# Patient Record
Sex: Male | Born: 1955 | Race: Black or African American | Hispanic: No | Smoking: Current every day smoker
Health system: Southern US, Community
[De-identification: ages and names within clinical notes are randomized; demographics above are authoritative.]

## PROBLEM LIST (undated history)

## (undated) DIAGNOSIS — E119 Type 2 diabetes mellitus without complications: Secondary | ICD-10-CM

## (undated) DIAGNOSIS — M549 Dorsalgia, unspecified: Secondary | ICD-10-CM

## (undated) DIAGNOSIS — E7800 Pure hypercholesterolemia, unspecified: Secondary | ICD-10-CM

## (undated) DIAGNOSIS — B1920 Unspecified viral hepatitis C without hepatic coma: Secondary | ICD-10-CM

## (undated) DIAGNOSIS — I10 Essential (primary) hypertension: Secondary | ICD-10-CM

## (undated) HISTORY — PX: HIP SURGERY: SHX245

## (undated) HISTORY — PX: SHOULDER SURGERY: SHX246

## (undated) HISTORY — PX: BACK SURGERY: SHX140

---

## 1998-03-19 ENCOUNTER — Emergency Department (HOSPITAL_COMMUNITY): Admission: EM | Admit: 1998-03-19 | Discharge: 1998-03-19 | Payer: Self-pay | Admitting: Emergency Medicine

## 2000-09-12 ENCOUNTER — Emergency Department (HOSPITAL_COMMUNITY): Admission: EM | Admit: 2000-09-12 | Discharge: 2000-09-13 | Payer: Self-pay | Admitting: Emergency Medicine

## 2001-12-17 ENCOUNTER — Encounter: Payer: Self-pay | Admitting: Internal Medicine

## 2001-12-17 ENCOUNTER — Ambulatory Visit (HOSPITAL_COMMUNITY): Admission: RE | Admit: 2001-12-17 | Discharge: 2001-12-17 | Payer: Self-pay | Admitting: Internal Medicine

## 2002-02-03 ENCOUNTER — Ambulatory Visit (HOSPITAL_COMMUNITY): Admission: RE | Admit: 2002-02-03 | Discharge: 2002-02-03 | Payer: Self-pay | Admitting: Neurosurgery

## 2002-02-13 ENCOUNTER — Encounter: Payer: Self-pay | Admitting: Neurosurgery

## 2002-02-13 ENCOUNTER — Ambulatory Visit (HOSPITAL_COMMUNITY): Admission: RE | Admit: 2002-02-13 | Discharge: 2002-02-13 | Payer: Self-pay | Admitting: Neurosurgery

## 2002-02-23 ENCOUNTER — Emergency Department (HOSPITAL_COMMUNITY): Admission: EM | Admit: 2002-02-23 | Discharge: 2002-02-23 | Payer: Self-pay | Admitting: Emergency Medicine

## 2002-02-23 ENCOUNTER — Encounter: Payer: Self-pay | Admitting: Emergency Medicine

## 2002-03-01 ENCOUNTER — Encounter: Payer: Self-pay | Admitting: Emergency Medicine

## 2002-03-01 ENCOUNTER — Emergency Department (HOSPITAL_COMMUNITY): Admission: EM | Admit: 2002-03-01 | Discharge: 2002-03-01 | Payer: Self-pay | Admitting: Emergency Medicine

## 2002-03-31 ENCOUNTER — Encounter: Payer: Self-pay | Admitting: Neurosurgery

## 2002-04-04 ENCOUNTER — Encounter: Payer: Self-pay | Admitting: Neurosurgery

## 2002-04-04 ENCOUNTER — Inpatient Hospital Stay (HOSPITAL_COMMUNITY): Admission: RE | Admit: 2002-04-04 | Discharge: 2002-04-11 | Payer: Self-pay | Admitting: Neurosurgery

## 2002-04-11 ENCOUNTER — Inpatient Hospital Stay (HOSPITAL_COMMUNITY)
Admission: RE | Admit: 2002-04-11 | Discharge: 2002-04-17 | Payer: Self-pay | Admitting: Physical Medicine & Rehabilitation

## 2011-06-11 DIAGNOSIS — B182 Chronic viral hepatitis C: Secondary | ICD-10-CM | POA: Insufficient documentation

## 2011-06-11 DIAGNOSIS — Z720 Tobacco use: Secondary | ICD-10-CM | POA: Insufficient documentation

## 2011-07-12 DIAGNOSIS — G894 Chronic pain syndrome: Secondary | ICD-10-CM | POA: Insufficient documentation

## 2012-02-05 DIAGNOSIS — Z9989 Dependence on other enabling machines and devices: Secondary | ICD-10-CM | POA: Insufficient documentation

## 2012-09-15 DIAGNOSIS — M533 Sacrococcygeal disorders, not elsewhere classified: Secondary | ICD-10-CM | POA: Insufficient documentation

## 2013-02-18 DIAGNOSIS — F309 Manic episode, unspecified: Secondary | ICD-10-CM | POA: Insufficient documentation

## 2013-03-22 DIAGNOSIS — Z981 Arthrodesis status: Secondary | ICD-10-CM | POA: Insufficient documentation

## 2013-03-22 DIAGNOSIS — Q675 Congenital deformity of spine: Secondary | ICD-10-CM | POA: Insufficient documentation

## 2013-05-11 DIAGNOSIS — M4325 Fusion of spine, thoracolumbar region: Secondary | ICD-10-CM | POA: Insufficient documentation

## 2013-08-09 DIAGNOSIS — M21371 Foot drop, right foot: Secondary | ICD-10-CM | POA: Insufficient documentation

## 2013-08-09 DIAGNOSIS — M21372 Foot drop, left foot: Secondary | ICD-10-CM | POA: Insufficient documentation

## 2013-12-29 DIAGNOSIS — M961 Postlaminectomy syndrome, not elsewhere classified: Secondary | ICD-10-CM | POA: Insufficient documentation

## 2013-12-29 DIAGNOSIS — F329 Major depressive disorder, single episode, unspecified: Secondary | ICD-10-CM | POA: Insufficient documentation

## 2013-12-29 DIAGNOSIS — F32A Depression, unspecified: Secondary | ICD-10-CM | POA: Insufficient documentation

## 2013-12-29 DIAGNOSIS — G4700 Insomnia, unspecified: Secondary | ICD-10-CM | POA: Insufficient documentation

## 2013-12-29 DIAGNOSIS — E119 Type 2 diabetes mellitus without complications: Secondary | ICD-10-CM | POA: Insufficient documentation

## 2013-12-29 DIAGNOSIS — I10 Essential (primary) hypertension: Secondary | ICD-10-CM | POA: Insufficient documentation

## 2014-04-13 DIAGNOSIS — N529 Male erectile dysfunction, unspecified: Secondary | ICD-10-CM | POA: Insufficient documentation

## 2014-04-13 DIAGNOSIS — Z96643 Presence of artificial hip joint, bilateral: Secondary | ICD-10-CM | POA: Insufficient documentation

## 2014-06-19 ENCOUNTER — Emergency Department (HOSPITAL_BASED_OUTPATIENT_CLINIC_OR_DEPARTMENT_OTHER)
Admission: EM | Admit: 2014-06-19 | Discharge: 2014-06-19 | Disposition: A | Discharge status: Home / Self Care | Payer: Medicare Other | Attending: Emergency Medicine | Admitting: Emergency Medicine

## 2014-06-19 ENCOUNTER — Encounter (HOSPITAL_BASED_OUTPATIENT_CLINIC_OR_DEPARTMENT_OTHER): Payer: Self-pay | Admitting: Emergency Medicine

## 2014-06-19 DIAGNOSIS — I10 Essential (primary) hypertension: Secondary | ICD-10-CM | POA: Diagnosis not present

## 2014-06-19 DIAGNOSIS — Z8619 Personal history of other infectious and parasitic diseases: Secondary | ICD-10-CM | POA: Insufficient documentation

## 2014-06-19 DIAGNOSIS — M25531 Pain in right wrist: Secondary | ICD-10-CM | POA: Diagnosis not present

## 2014-06-19 DIAGNOSIS — Z79899 Other long term (current) drug therapy: Secondary | ICD-10-CM | POA: Insufficient documentation

## 2014-06-19 DIAGNOSIS — M25532 Pain in left wrist: Secondary | ICD-10-CM | POA: Insufficient documentation

## 2014-06-19 DIAGNOSIS — M25511 Pain in right shoulder: Secondary | ICD-10-CM | POA: Insufficient documentation

## 2014-06-19 DIAGNOSIS — M25512 Pain in left shoulder: Secondary | ICD-10-CM | POA: Insufficient documentation

## 2014-06-19 DIAGNOSIS — Z720 Tobacco use: Secondary | ICD-10-CM | POA: Diagnosis not present

## 2014-06-19 DIAGNOSIS — Z7982 Long term (current) use of aspirin: Secondary | ICD-10-CM | POA: Diagnosis not present

## 2014-06-19 HISTORY — DX: Essential (primary) hypertension: I10

## 2014-06-19 HISTORY — DX: Unspecified viral hepatitis C without hepatic coma: B19.20

## 2014-06-19 HISTORY — DX: Dorsalgia, unspecified: M54.9

## 2014-06-19 MED ORDER — HYDROCODONE-ACETAMINOPHEN 5-325 MG PO TABS: 2.0000 | ORAL_TABLET | ORAL | Status: DC | PRN

## 2014-06-19 MED ORDER — PREDNISONE 10 MG PO TABS: 20.0000 mg | ORAL_TABLET | Freq: Two times a day (BID) | ORAL | Status: DC

## 2014-06-19 NOTE — ED Provider Notes (Signed)
CSN: [id number]     Arrival date & time 06/19/14  1228 History   First MD Initiated Contact with Patient 06/19/14 1255     Chief Complaint  Patient presents with  . Wrist Pain  . Shoulder Pain     (Consider location/radiation/quality/duration/timing/severity/associated sxs/prior Treatment) HPI Comments: Patient is a 58 year old male with history of hypertension and hepatitis C. Presents with complaints of pain in his wrists and shoulders that has been worsening over the past 4-6 months. He denies any specific injury or trauma. He denies any fever or chills.  Patient is a 58 y.o. male presenting with wrist pain. The history is provided by the patient.  Wrist Pain This is a new problem. Episode onset: 4-6 months ago. The problem occurs constantly. The problem has been gradually worsening. Exacerbated by: Movement and range of motion. Nothing relieves the symptoms. Treatments tried: Aleve. The treatment provided mild relief.    Past Medical History  Diagnosis Date  . Hypertension   . Hepatitis C   . Back pain    Past Surgical History  Procedure Laterality Date  . Back surgery     No family history on file. History  Substance Use Topics  . Smoking status: Current Every Day Smoker  . Smokeless tobacco: Not on file  . Alcohol Use: Not on file    Review of Systems  All other systems reviewed and are negative.     Allergies  Ivp dye  Home Medications   Prior to Admission medications   Medication Sig Start Date End Date Taking? Authorizing Provider  amLODipine (NORVASC) 10 MG tablet Take 10 mg by mouth daily.   Yes Historical Provider, MD  aspirin 81 MG tablet Take 81 mg by mouth daily.   Yes Historical Provider, MD  clonazePAM (KLONOPIN) 2 MG tablet Take 2 mg by mouth at bedtime.   Yes Historical Provider, MD  lisinopril (PRINIVIL,ZESTRIL) 20 MG tablet Take 20 mg by mouth daily.   Yes Historical Provider, MD  metFORMIN (GLUCOPHAGE) 500 MG tablet Take 500 mg by mouth 2  (two) times daily with a meal.   Yes Historical Provider, MD  potassium chloride (K-DUR) 10 MEQ tablet Take 10 mEq by mouth daily.   Yes Historical Provider, MD   BP 163/88  Pulse 94  Temp(Src) 98.4 F (36.9 C) (Oral)  Resp 20  Ht 6' (1.829 m)  Wt 206 lb (93.441 kg)  BMI 27.93 kg/m2  SpO2 97% Physical Exam  Nursing note and vitals reviewed. Constitutional: He is oriented to person, place, and time. He appears well-developed and well-nourished. No distress.  HENT:  Head: Normocephalic and atraumatic.  Neck: Normal range of motion. Neck supple.  Musculoskeletal: Normal range of motion.  The bilateral shoulders and wrists appear grossly normal. There is no joint effusion or warmth. There is pain with range of motion.  Neurological: He is alert and oriented to person, place, and time.  Skin: Skin is warm and dry. He is not diaphoretic.    ED Course  Procedures (including critical care time) Labs Review Labs Reviewed - No data to display  Imaging Review No results found.   EKG Interpretation None      MDM   Final diagnoses:  None    Patient appears to have some sort of arthritic process in his shoulders and wrists. I will treat with prednisone and pain medication. He is to discuss this with his primary Dr. in followup    Geoffery Lyonsouglas Aristeo Hankerson, MD 06/19/14 [phone]

## 2014-06-19 NOTE — ED Notes (Signed)
Pt having bilateral wrist pain and left shoulder pain for two months.  Pt states pain is worsening.

## 2014-06-19 NOTE — Discharge Instructions (Signed)
Prednisone as prescribed.  Percocet as prescribed as needed for pain.  Follow up with your primary doctor if not improving in the next week.   Arthralgia Your caregiver has diagnosed you as suffering from an arthralgia. Arthralgia means there is pain in a joint. This can come from many reasons including:  Bruising the joint which causes soreness (inflammation) in the joint.  Wear and tear on the joints which occur as we grow older (osteoarthritis).  Overusing the joint.  Various forms of arthritis.  Infections of the joint. Regardless of the cause of pain in your joint, most of these different pains respond to anti-inflammatory drugs and rest. The exception to this is when a joint is infected, and these cases are treated with antibiotics, if it is a bacterial infection. HOME CARE INSTRUCTIONS   Rest the injured area for as long as directed by your caregiver. Then slowly start using the joint as directed by your caregiver and as the pain allows. Crutches as directed may be useful if the ankles, knees or hips are involved. If the knee was splinted or casted, continue use and care as directed. If an stretchy or elastic wrapping bandage has been applied today, it should be removed and re-applied every 3 to 4 hours. It should not be applied tightly, but firmly enough to keep swelling down. Watch toes and feet for swelling, bluish discoloration, coldness, numbness or excessive pain. If any of these problems (symptoms) occur, remove the ace bandage and re-apply more loosely. If these symptoms persist, contact your caregiver or return to this location.  For the first 24 hours, keep the injured extremity elevated on pillows while lying down.  Apply ice for 15-20 minutes to the sore joint every couple hours while awake for the first half day. Then 03-04 times per day for the first 48 hours. Put the ice in a plastic bag and place a towel between the bag of ice and your skin.  Wear any splinting,  casting, elastic bandage applications, or slings as instructed.  Only take over-the-counter or prescription medicines for pain, discomfort, or fever as directed by your caregiver. Do not use aspirin immediately after the injury unless instructed by your physician. Aspirin can cause increased bleeding and bruising of the tissues.  If you were given crutches, continue to use them as instructed and do not resume weight bearing on the sore joint until instructed. Persistent pain and inability to use the sore joint as directed for more than 2 to 3 days are warning signs indicating that you should see a caregiver for a follow-up visit as soon as possible. Initially, a hairline fracture (break in bone) may not be evident on X-rays. Persistent pain and swelling indicate that further evaluation, non-weight bearing or use of the joint (use of crutches or slings as instructed), or further X-rays are indicated. X-rays may sometimes not show a small fracture until a week or 10 days later. Make a follow-up appointment with your own caregiver or one to whom we have referred you. A radiologist (specialist in reading X-rays) may read your X-rays. Make sure you know how you are to obtain your X-ray results. Do not assume everything is normal if you do not hear from us. SEEK MEDICAL CARE IF: Bruising, swelling, or pain increases. SEEK IMMEDIATE MEDICAL CARE IF:   Your fingers or toes are numb or blue.  The pain is not responding to medications and continues to stay the same or get worse.  The pain in your  joint becomes severe.  You develop a fever over 102 F (38.9 C).  It becomes impossible to move or use the joint. MAKE SURE YOU:   Understand these instructions.  Will watch your condition.  Will get help right away if you are not doing well or get worse. Document Released: 08/31/2005 Document Revised: 11/23/2011 Document Reviewed: 04/18/2008 [facility] Patient Information 2015 [place], [place]. This  information is not intended to replace advice given to you by your health care provider. Make sure you discuss any questions you have with your health care provider.

## 2014-07-19 DIAGNOSIS — E785 Hyperlipidemia, unspecified: Secondary | ICD-10-CM | POA: Insufficient documentation

## 2015-09-04 DIAGNOSIS — Z9181 History of falling: Secondary | ICD-10-CM | POA: Insufficient documentation

## 2016-01-03 DIAGNOSIS — K439 Ventral hernia without obstruction or gangrene: Secondary | ICD-10-CM | POA: Insufficient documentation

## 2016-07-27 DIAGNOSIS — F419 Anxiety disorder, unspecified: Secondary | ICD-10-CM | POA: Insufficient documentation

## 2017-02-04 ENCOUNTER — Emergency Department (HOSPITAL_BASED_OUTPATIENT_CLINIC_OR_DEPARTMENT_OTHER)
Admission: EM | Admit: 2017-02-04 | Discharge: 2017-02-04 | Disposition: A | Discharge status: Home / Self Care | Payer: Medicare Other | Attending: Emergency Medicine | Admitting: Emergency Medicine

## 2017-02-04 ENCOUNTER — Emergency Department (HOSPITAL_BASED_OUTPATIENT_CLINIC_OR_DEPARTMENT_OTHER): Payer: Medicare Other

## 2017-02-04 DIAGNOSIS — F17200 Nicotine dependence, unspecified, uncomplicated: Secondary | ICD-10-CM | POA: Diagnosis not present

## 2017-02-04 DIAGNOSIS — Z7982 Long term (current) use of aspirin: Secondary | ICD-10-CM | POA: Insufficient documentation

## 2017-02-04 DIAGNOSIS — M25531 Pain in right wrist: Secondary | ICD-10-CM | POA: Diagnosis not present

## 2017-02-04 DIAGNOSIS — M25562 Pain in left knee: Secondary | ICD-10-CM | POA: Insufficient documentation

## 2017-02-04 DIAGNOSIS — Z7984 Long term (current) use of oral hypoglycemic drugs: Secondary | ICD-10-CM | POA: Diagnosis not present

## 2017-02-04 DIAGNOSIS — M25569 Pain in unspecified knee: Secondary | ICD-10-CM

## 2017-02-04 NOTE — ED Triage Notes (Signed)
Pt states chronic knee pain for past year that has gotten worse in recent days.  Pt states pain is so bad now he cannot weight-bear.  Pt also c/o right wrist pain, chronic, that has been worsening recently as well.

## 2017-02-04 NOTE — ED Notes (Signed)
PMS intact before and after. Pt tolerated well. All questions answered. 

## 2017-02-04 NOTE — ED Notes (Signed)
Pt transported to xray 

## 2017-02-04 NOTE — Discharge Instructions (Signed)
Please read attached information. If you experience any new or worsening signs or symptoms please return to the emergency room for evaluation. Please follow-up with your primary care provider or specialist as discussed.  °

## 2017-02-04 NOTE — ED Provider Notes (Signed)
[facility]   CSN: [id number] Arrival date & time: 02/04/17  1003     History   Chief Complaint Chief Complaint  Patient presents with  . Knee Pain  . Wrist Pain    HPI Xavier Kennedy is a 61 y.o. male.  HPI  61 year old male presents today with complaints of right wrist and left knee pain. Patient notes several year history of wrist pain. He has been seen for this in the past, given a brace and referred to orthopedic specialist. No interventions were taken at that time, but continues to have discomfort with range of motion of the wrist. He denies any significant changes in his wrist pain. Patient also notes that over the last year he's had worsening left knee pain. He reports this is worse with ambulation, improved with rest. He denies any fever, warmth, swelling or edema. He denies any trauma to the wrist or the knee. He notes chronic back issues and numerous shoulders including shoulder and hip. He notes taking oxycodone as needed for discomfort which does not improve his knee or wrist pain.  Past Medical History:  Diagnosis Date  . Back pain   . Hepatitis C   . Hypertension     There are no active problems to display for this patient.   Past Surgical History:  Procedure Laterality Date  . BACK SURGERY       Home Medications    Prior to Admission medications   Medication Sig Start Date End Date Taking? Authorizing Provider  amLODipine (NORVASC) 10 MG tablet Take 10 mg by mouth daily.   Yes [provider]  aspirin 81 MG tablet Take 81 mg by mouth daily.   Yes [provider]  clonazePAM (KLONOPIN) 2 MG tablet Take 2 mg by mouth at bedtime.   Yes [provider]  lisinopril (PRINIVIL,ZESTRIL) 20 MG tablet Take 20 mg by mouth daily.   Yes [provider]  metFORMIN (GLUCOPHAGE) 500 MG tablet Take 500 mg by mouth 2 (two) times daily with a meal.   Yes [provider]  oxyCODONE-acetaminophen  (PERCOCET) 10-325 MG tablet Take 1 tablet by mouth every 4 (four) hours as needed for pain.   Yes [provider]  potassium chloride (K-DUR) 10 MEQ tablet Take 10 mEq by mouth daily.   Yes [provider]  HYDROcodone-acetaminophen (NORCO) 5-325 MG per tablet Take 2 tablets by mouth every 4 (four) hours as needed. 06/19/14   Geoffery Lyons, MD  predniSONE (DELTASONE) 10 MG tablet Take 2 tablets (20 mg total) by mouth 2 (two) times daily. 06/19/14   Geoffery Lyons, MD    Family History No family history on file.  Social History Social History  Substance Use Topics  . Smoking status: Current Every Day Smoker  . Smokeless tobacco: Not on file  . Alcohol use Not on file     Allergies   Ivp dye [iodinated diagnostic agents] and Trazodone and nefazodone   Review of Systems Review of Systems  All other systems reviewed and are negative.    Physical Exam Updated Vital Signs BP (!) 146/84 (BP Location: Left Arm)   Pulse 71   Temp 98.7 F (37.1 C) (Oral)   Resp 18   Ht 6' (1.829 m)   Wt 91.2 kg (201 lb)   SpO2 97%   BMI 27.26 kg/m   Physical Exam  Constitutional: He is oriented to person, place, and time. He appears well-developed and well-nourished.  HENT:  Head: Normocephalic and atraumatic.  Eyes: Conjunctivae are normal. Pupils are equal, round, and reactive to light. Right eye exhibits no discharge. Left eye exhibits no discharge. No scleral icterus.  Neck: Normal range of motion. No JVD present. No tracheal deviation present.  Pulmonary/Chest: Effort normal. No stridor.  Musculoskeletal:  Left knee atraumatic full active range of motion. No warmth to touch, no swelling. Tenderness to palpation in the popliteal fossa pain with anterior tibial translation, no significant laxity in all directions.  Right wrist atraumatic, no swelling edema, warmth to touch. Full active range of motion, pain throughout range of motion distal sensation strength or motor  function intact radial pulse 2+ grip strength 5 out of 5  Neurological: He is alert and oriented to person, place, and time. Coordination normal.  Psychiatric: He has a normal mood and affect. His behavior is normal. Judgment and thought content normal.  Nursing note and vitals reviewed.    ED Treatments / Results  Labs (all labs ordered are listed, but only abnormal results are displayed) Labs Reviewed - No data to display  EKG  EKG Interpretation None       Radiology Dg Knee Complete 4 Views Left  Result Date: 02/04/2017 CLINICAL DATA:  Pt started having left knee pain a year ago and in the past week it has gotten worse with no injury,pain medial and posterior,swelling EXAM: LEFT KNEE - COMPLETE 4+ VIEW COMPARISON:  None. FINDINGS: There is no acute fracture or dislocation. No joint effusion. Minimal narrowing of the medial and lateral compartments. IMPRESSION: Mild degenerative changes.  No evidence for acute  abnormality. Electronically Signed   By: Norva PavlovElizabeth  Brown M.D.   On: 02/04/2017 10:42    Procedures Procedures (including critical care time)  Medications Ordered in ED Medications - No data to display   Initial Impression / Assessment and Plan / ED Course  I have reviewed the triage vital signs and the nursing notes.  Pertinent labs & imaging results that were available during my care of the patient were reviewed by me and considered in my medical decision making (see chart for details).      Final Clinical Impressions(s) / ED Diagnoses   Final diagnoses:  Acute pain of left knee  Right wrist pain    61 year old male presents today with chronic knee and wrist pain. He has no signs of infectious etiology, no signs of trauma. Plain films of his knee show mild degenerative changes. Patient will be referred to sports medicine professional for ongoing management of his chronic issues. Patient and he has medication at home, he is encouraged to use this as directed.  Patient had no further questions or concerns at time of discharge  New Prescriptions New Prescriptions   No medications on file         Eyvonne MechanicHedges, Rykker Coviello, Cordelia Poche-C 02/04/17 1104    Lavera GuiseLiu, Dana Duo, MD 02/04/17 [id number]

## 2017-02-04 NOTE — ED Notes (Signed)
Jeff, PA-C in room with pt now. 

## 2017-02-10 ENCOUNTER — Encounter: Payer: Self-pay | Admitting: Family Medicine

## 2017-02-10 ENCOUNTER — Ambulatory Visit (HOSPITAL_BASED_OUTPATIENT_CLINIC_OR_DEPARTMENT_OTHER)
Admission: RE | Admit: 2017-02-10 | Discharge: 2017-02-10 | Disposition: A | Discharge status: Home / Self Care | Payer: Medicare Other | Source: Ambulatory Visit | Attending: Family Medicine | Admitting: Family Medicine

## 2017-02-10 ENCOUNTER — Ambulatory Visit (INDEPENDENT_AMBULATORY_CARE_PROVIDER_SITE_OTHER): Payer: Medicare Other | Admitting: Family Medicine

## 2017-02-10 VITALS — BP 135/84 | HR 72 | Ht 72.0 in | Wt 201.0 lb

## 2017-02-10 DIAGNOSIS — M25562 Pain in left knee: Secondary | ICD-10-CM | POA: Diagnosis not present

## 2017-02-10 DIAGNOSIS — M25531 Pain in right wrist: Secondary | ICD-10-CM

## 2017-02-10 DIAGNOSIS — G8929 Other chronic pain: Secondary | ICD-10-CM | POA: Diagnosis not present

## 2017-02-10 MED ORDER — METHYLPREDNISOLONE ACETATE 40 MG/ML IJ SUSP
40.0000 mg | Freq: Once | INTRAMUSCULAR | Status: AC
  Administered 2017-02-10: 40 mg via INTRA_ARTICULAR

## 2017-02-10 NOTE — Patient Instructions (Addendum)
Your pain is due to arthritis of your knee. These are the different medications you can take for this: Tylenol 500mg  1-2 tabs three times a day for pain. Aleve 1-2 tabs twice a day with food Capsaicin, aspercreme, or biofreeze topically up to four times a day may also help with pain. Some supplements that may help for arthritis: Boswellia extract, curcumin, pycnogenol Cortisone injections are an option - you were given this today in your knee. If cortisone injections do not help, there are different types of shots that may help but they take longer to take effect. It's important that you continue to stay active. Straight leg raises, knee extensions 3 sets of 10 once a day (add ankle weight if these become too easy). Consider physical therapy to strengthen muscles around the joints that hurt to take pressure off of the joint itself - start occupational therapy for extensor tendinopathy of your wrist. Shoe inserts with good arch support may be helpful for the knee. Walker or cane if needed. Heat or ice 15 minutes at a time 3-4 times a day as needed to help with pain (whichever feels better - typically heat). Water aerobics and cycling with low resistance are the best two types of exercise for arthritis. Follow up with me in 6 weeks for reevaluation.

## 2017-02-11 DIAGNOSIS — M25562 Pain in left knee: Secondary | ICD-10-CM | POA: Insufficient documentation

## 2017-02-11 DIAGNOSIS — M25531 Pain in right wrist: Secondary | ICD-10-CM | POA: Insufficient documentation

## 2017-02-11 DIAGNOSIS — B182 Chronic viral hepatitis C: Secondary | ICD-10-CM | POA: Insufficient documentation

## 2017-02-11 NOTE — Progress Notes (Signed)
PCP: Patria Mane, MD  Subjective:   HPI: Patient is a 61 y.o. male here for right wrist and left knee pain.  Patient reports he's had left knee pain anteriorly for about 1 year. No known injury or trauma. Pain level 9/10 and sharp. Pain worse with walking. Has tried gabapentin, oxycodone, hydrocodone. No skin changes, numbness. Right wrist was broken about 20 years ago - was casted for 6-8 weeks. Pain level now 8/10 and has been bad past 3 years. Pain is dorsal but radiates into hand and up to elbow. Right handed. No skin changes, numbness.  Past Medical History:  Diagnosis Date  . Back pain   . Hepatitis C   . Hypertension     Current Outpatient Prescriptions on File Prior to Visit  Medication Sig Dispense Refill  . amLODipine (NORVASC) 10 MG tablet Take 10 mg by mouth daily.    Marland Kitchen aspirin 81 MG tablet Take 81 mg by mouth daily.    . clonazePAM (KLONOPIN) 2 MG tablet Take 2 mg by mouth at bedtime.    Marland Kitchen HYDROcodone-acetaminophen (NORCO) 5-325 MG per tablet Take 2 tablets by mouth every 4 (four) hours as needed. 20 tablet 0  . lisinopril (PRINIVIL,ZESTRIL) 20 MG tablet Take 20 mg by mouth daily.    . metFORMIN (GLUCOPHAGE) 500 MG tablet Take 500 mg by mouth 2 (two) times daily with a meal.    . oxyCODONE-acetaminophen (PERCOCET) 10-325 MG tablet Take 1 tablet by mouth every 4 (four) hours as needed for pain.    . potassium chloride (K-DUR) 10 MEQ tablet Take 10 mEq by mouth daily.    . predniSONE (DELTASONE) 10 MG tablet Take 2 tablets (20 mg total) by mouth 2 (two) times daily. 20 tablet 0   No current facility-administered medications on file prior to visit.     Past Surgical History:  Procedure Laterality Date  . BACK SURGERY      Allergies  Allergen Reactions  . Ivp Dye [Iodinated Diagnostic Agents]   . Morphine Nausea Only  . Oxycodone Nausea Only    Weight loss  . Trazodone And Nefazodone Other (See Comments)    Pt reports vision changes    Social History    Social History  . Marital status: Divorced    Spouse name: N/A  . Number of children: N/A  . Years of education: N/A   Occupational History  . Not on file.   Social History Main Topics  . Smoking status: Current Every Day Smoker  . Smokeless tobacco: Never Used  . Alcohol use Not on file  . Drug use: Unknown  . Sexual activity: Not on file   Other Topics Concern  . Not on file   Social History Narrative  . No narrative on file    No family history on file.  BP 135/84   Pulse 72   Ht 6' (1.829 m)   Wt 201 lb (91.2 kg)   BMI 27.26 kg/m   Review of Systems: See HPI above.     Objective:  Physical Exam:  Gen: NAD, comfortable in exam room  Left knee: No gross deformity, ecchymoses, effusion. Mild TTP medial joint line, post patellar facets.  No other tenderness. FROM. Negative ant/post drawers. Negative valgus/varus testing. Negative lachmanns. Negative mcmurrays, apleys, patellar apprehension. NV intact distally.  Right knee: FROM without pain.  Right wrist: No gross deformity, swelling, bruising. TTP dorsal wrist joint into ulnar side of hand.  No other tenderness. Very mild limitation flexion  and extension compared to left wrist. FROM digits. Negative tinels. NVI distally.   Assessment & Plan:  1. 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  Discussed tylenol, topical medications, some supplements that may help, aleve.  Injection given today.  Shown home exercises to do daily.  Consider physical therapy, viscosupplementation if not improving.  F/u in 6 weeks.  After informed written consent, patient was seated on exam table. Left knee was prepped with alcohol swab and utilizing anteromedial approach, patient's left knee was injected intraarticularly with 3:1 bupivicaine: depomedrol. Patient tolerated the procedure well without immediate complications.  2. Right wrist pain - chronic.  Radiographs negative including no evidence arthritis of wrist joint or nonunion.   Consistent with tendinopathy.  Start occupational therapy.  Can continue wrist brace he has tried as well, topical medications, tylenol, aleve.  F/u in 6 weeks.

## 2017-02-11 NOTE — Assessment & Plan Note (Signed)
chronic.  Radiographs negative including no evidence arthritis of wrist joint or nonunion.  Consistent with tendinopathy.  Start occupational therapy.  Can continue wrist brace he has tried as well, topical medications, tylenol, aleve.  F/u in 6 weeks.

## 2017-02-11 NOTE — Assessment & Plan Note (Signed)
2/2 DJD.  Discussed tylenol, topical medications, some supplements that may help, aleve.  Injection given today.  Shown home exercises to do daily.  Consider physical therapy, viscosupplementation if not improving.  F/u in 6 weeks.  After informed written consent, patient was seated on exam table. Left knee was prepped with alcohol swab and utilizing anteromedial approach, patient's left knee was injected intraarticularly with 3:1 bupivicaine: depomedrol. Patient tolerated the procedure well without immediate complications.

## 2017-03-24 ENCOUNTER — Ambulatory Visit (INDEPENDENT_AMBULATORY_CARE_PROVIDER_SITE_OTHER): Payer: Medicare Other | Admitting: Family Medicine

## 2017-03-24 ENCOUNTER — Encounter: Payer: Self-pay | Admitting: Family Medicine

## 2017-03-24 VITALS — BP 127/78 | HR 79 | Ht 72.0 in | Wt 198.0 lb

## 2017-03-24 DIAGNOSIS — M25531 Pain in right wrist: Secondary | ICD-10-CM | POA: Diagnosis not present

## 2017-03-24 DIAGNOSIS — G8929 Other chronic pain: Secondary | ICD-10-CM

## 2017-03-24 DIAGNOSIS — M25562 Pain in left knee: Secondary | ICD-10-CM

## 2017-03-24 NOTE — Patient Instructions (Signed)
We will go ahead with an MRI of your left knee given you're not improving as expected to assess for a meniscus tear. Continue with the therapy for your wrist.  These are the instructions from last visit about what can help as well: These are the different medications you can take for this: Tylenol 500mg  1-2 tabs three times a day for pain. Aleve 1-2 tabs twice a day with food Capsaicin, aspercreme, or biofreeze topically up to four times a day may also help with pain. Some supplements that may help for arthritis: Boswellia extract, curcumin, pycnogenol It's important that you continue to stay active. Straight leg raises, knee extensions 3 sets of 10 once a day (add ankle weight if these become too easy). Consider physical therapy to strengthen muscles around the joints that hurt to take pressure off of the joint itself - start occupational therapy for extensor tendinopathy of your wrist. Shoe inserts with good arch support may be helpful for the knee. Walker or cane if needed. Heat or ice 15 minutes at a time 3-4 times a day as needed to help with pain (whichever feels better - typically heat). Water aerobics and cycling with low resistance are the best two types of exercise for arthritis. Follow up with me in 6 weeks for your wrist - if not improving will consider MRI.

## 2017-03-26 NOTE — Assessment & Plan Note (Signed)
Would expect more improvement following injection if this were purely due to DJD.  Discussed options - will go ahead with MRI to assess for meniscus tear medially.  Tylenol, topical medications, supplements that may help, aleve.  Home exercises.

## 2017-03-26 NOTE — Progress Notes (Addendum)
PCP: Patria ManeGassemi, Mike, MD  Subjective:   HPI: Patient is a 61 y.o. male here for right wrist and left knee pain.  5/30: Patient reports he's had left knee pain anteriorly for about 1 year. No known injury or trauma. Pain level 9/10 and sharp. Pain worse with walking. Has tried gabapentin, oxycodone, hydrocodone. No skin changes, numbness. Right wrist was broken about 20 years ago - was casted for 6-8 weeks. Pain level now 8/10 and has been bad past 3 years. Pain is dorsal but radiates into hand and up to elbow. Right handed. No skin changes, numbness.  7/11: Patient returns with continued pain. States he had first occupational therapy visit for wrist yesterday. Pain level up to 7/10, sharp dorsal wrist. He reports shot for left knee helped for about 1-2 weeks. Only taking his pain medication and gabapentin. Pain level in knee is 6/10, sharp, anterior. No skin changes, numbness.  Past Medical History:  Diagnosis Date  . Back pain   . Hepatitis C   . Hypertension     Current Outpatient Prescriptions on File Prior to Visit  Medication Sig Dispense Refill  . amLODipine (NORVASC) 10 MG tablet Take 10 mg by mouth daily.    Marland Kitchen. aspirin 81 MG tablet Take 81 mg by mouth daily.    . clonazePAM (KLONOPIN) 2 MG tablet Take 2 mg by mouth at bedtime.    . gabapentin (NEURONTIN) 300 MG capsule 1 CAP AT BEDTIME X5DAYS, THEN INCREASE TO 1 TWICE A DAY X5DAYS, THEN 1 CAP 3 TIMES A DAY  1  . lisinopril (PRINIVIL,ZESTRIL) 20 MG tablet Take 20 mg by mouth daily.    . metFORMIN (GLUCOPHAGE) 500 MG tablet Take 500 mg by mouth 2 (two) times daily with a meal.    . potassium chloride (K-DUR) 10 MEQ tablet Take 10 mEq by mouth daily.    . predniSONE (DELTASONE) 10 MG tablet Take 2 tablets (20 mg total) by mouth 2 (two) times daily. 20 tablet 0   No current facility-administered medications on file prior to visit.     Past Surgical History:  Procedure Laterality Date  . BACK SURGERY       Allergies  Allergen Reactions  . Ivp Dye [Iodinated Diagnostic Agents]   . Morphine Nausea Only  . Oxycodone Nausea Only    Weight loss  . Trazodone And Nefazodone Other (See Comments)    Pt reports vision changes    Social History   Social History  . Marital status: Divorced    Spouse name: N/A  . Number of children: N/A  . Years of education: N/A   Occupational History  . Not on file.   Social History Main Topics  . Smoking status: Current Every Day Smoker  . Smokeless tobacco: Never Used  . Alcohol use Not on file  . Drug use: Unknown  . Sexual activity: Not on file   Other Topics Concern  . Not on file   Social History Narrative  . No narrative on file    No family history on file.  BP 127/78   Pulse 79   Ht 6' (1.829 m)   Wt 198 lb (89.8 kg)   BMI 26.85 kg/m   Review of Systems: See HPI above.     Objective:  Physical Exam:  Gen: NAD, comfortable in exam room  Left knee: No gross deformity, ecchymoses, effusion. Mild TTP medial joint line, post patellar facets.  No other tenderness. FROM. Negative ant/post drawers. Negative valgus/varus testing.  Negative lachmanns. Negative mcmurrays, apleys, patellar apprehension. NV intact distally.  Right knee: FROM without pain.  Right wrist: No gross deformity, swelling, bruising. TTP dorsal wrist joint into ulnar side of hand.  No other tenderness. Very mild limitation flexion and extension compared to left wrist. FROM digits. NVI distally.   Assessment & Plan:  1. Left knee pain - Would expect more improvement following injection if this were purely due to DJD.  Discussed options - will go ahead with MRI to assess for meniscus tear medially.  Tylenol, topical medications, supplements that may help, aleve.  Home exercises.   2. Right wrist pain - chronic.  Radiographs were negative.  Consistent with tendinopathy.  Just started OT - continue this and follow up in 6 weeks.  Consider MRI if not  improving.  Addendum:  MRI of left knee reviewed and discussed with patient.  Only shows moderate arthritis.  No meniscus tear or other concerning findings.  Reassured patient.  Discussed physical therapy, viscosupplementation.  He will continue with treatment we discussed at his office visit, think about these, and call us if he would like to proceed with either.

## 2017-03-26 NOTE — Assessment & Plan Note (Signed)
chronic.  Radiographs were negative.  Consistent with tendinopathy.  Just started OT - continue this and follow up in 6 weeks.  Consider MRI if not improving.

## 2017-03-27 ENCOUNTER — Ambulatory Visit (HOSPITAL_BASED_OUTPATIENT_CLINIC_OR_DEPARTMENT_OTHER)
Admission: RE | Admit: 2017-03-27 | Discharge: 2017-03-27 | Disposition: A | Discharge status: Home / Self Care | Payer: Medicare Other | Source: Ambulatory Visit | Attending: Family Medicine | Admitting: Family Medicine

## 2017-03-27 DIAGNOSIS — M25562 Pain in left knee: Secondary | ICD-10-CM | POA: Diagnosis present

## 2017-03-27 DIAGNOSIS — M1712 Unilateral primary osteoarthritis, left knee: Secondary | ICD-10-CM | POA: Insufficient documentation

## 2017-03-27 DIAGNOSIS — G8929 Other chronic pain: Secondary | ICD-10-CM | POA: Diagnosis present

## 2017-05-05 ENCOUNTER — Encounter: Payer: Self-pay | Admitting: Family Medicine

## 2017-05-05 ENCOUNTER — Ambulatory Visit (INDEPENDENT_AMBULATORY_CARE_PROVIDER_SITE_OTHER): Payer: Medicare Other | Admitting: Family Medicine

## 2017-05-05 DIAGNOSIS — M25531 Pain in right wrist: Secondary | ICD-10-CM | POA: Diagnosis present

## 2017-05-05 DIAGNOSIS — G8929 Other chronic pain: Secondary | ICD-10-CM

## 2017-05-05 DIAGNOSIS — M25562 Pain in left knee: Secondary | ICD-10-CM

## 2017-05-05 NOTE — Patient Instructions (Signed)
We will see if your insurance approves the gel shots for your knee and contact you about these. These are the different medications you can take for this: Tylenol 500mg  1-2 tabs three times a day for pain. Aleve 1-2 tabs twice a day with food Capsaicin, aspercreme, or biofreeze topically up to four times a day may also help with pain. Some supplements that may help for arthritis: Boswellia extract, curcumin, pycnogenol It's important that you continue to stay active. Straight leg raises, knee extensions 3 sets of 10 once a day (add ankle weight if these become too easy). Consider physical therapy to strengthen muscles around the joints that hurt to take pressure off of the joint itself - continue the wrist/hand exercises you learned in occupational therapy most days of the week. Shoe inserts with good arch support may be helpful for the knee. Walker or cane if needed. Heat or ice 15 minutes at a time 3-4 times a day as needed to help with pain (whichever feels better - typically heat). Water aerobics and cycling with low resistance are the best two types of exercise for arthritis.

## 2017-05-06 NOTE — Assessment & Plan Note (Signed)
Known DJD confirmed with MRI.  Only mild improvement with cortisone - will look into viscosupplementation.  Tylenol, topical medications, supplements that may help, aleve as needed.  Home exercises.

## 2017-05-06 NOTE — Assessment & Plan Note (Signed)
chronic.  Radiographs were negative.  Consistent with tendinopathy which is improved currently.  Continue home exercises he learned in OT.

## 2017-05-06 NOTE — Progress Notes (Signed)
PCP: Patria Mane, MD  Subjective:   HPI: Patient is a 61 y.o. male here for right wrist and left knee pain.  5/30: Patient reports he's had left knee pain anteriorly for about 1 year. No known injury or trauma. Pain level 9/10 and sharp. Pain worse with walking. Has tried gabapentin, oxycodone, hydrocodone. No skin changes, numbness. Right wrist was broken about 20 years ago - was casted for 6-8 weeks. Pain level now 8/10 and has been bad past 3 years. Pain is dorsal but radiates into hand and up to elbow. Right handed. No skin changes, numbness.  7/11: Patient returns with continued pain. States he had first occupational therapy visit for wrist yesterday. Pain level up to 7/10, sharp dorsal wrist. He reports shot for left knee helped for about 1-2 weeks. Only taking his pain medication and gabapentin. Pain level in knee is 6/10, sharp, anterior. No skin changes, numbness.  8/22: Patient reports his right wrist is better currently. Pain still coming and going though. No swelling of wrist. His left knee has been bothering him though. Knee gave out 7-8 days ago without injury - was just walking at the time. Pain currently 0/10 but worse with walking, sharp. He's interested in trying viscosupplementation. No skin changes, numbness.  Past Medical History:  Diagnosis Date  . Back pain   . Hepatitis C   . Hypertension     Current Outpatient Prescriptions on File Prior to Visit  Medication Sig Dispense Refill  . amLODipine (NORVASC) 10 MG tablet Take 10 mg by mouth daily.    Marland Kitchen aspirin 81 MG tablet Take 81 mg by mouth daily.    . clonazePAM (KLONOPIN) 2 MG tablet Take 2 mg by mouth at bedtime.    . gabapentin (NEURONTIN) 300 MG capsule 1 CAP AT BEDTIME X5DAYS, THEN INCREASE TO 1 TWICE A DAY X5DAYS, THEN 1 CAP 3 TIMES A DAY  1  . lisinopril (PRINIVIL,ZESTRIL) 20 MG tablet Take 20 mg by mouth daily.    . metFORMIN (GLUCOPHAGE) 500 MG tablet Take 500 mg by mouth 2 (two)  times daily with a meal.    . NUCYNTA 75 MG tablet Take 75 mg by mouth 3 (three) times daily as needed.  0  . potassium chloride (K-DUR) 10 MEQ tablet Take 10 mEq by mouth daily.    . predniSONE (DELTASONE) 10 MG tablet Take 2 tablets (20 mg total) by mouth 2 (two) times daily. 20 tablet 0  . Tapentadol HCl 100 MG TABS Take by mouth.     No current facility-administered medications on file prior to visit.     Past Surgical History:  Procedure Laterality Date  . BACK SURGERY      Allergies  Allergen Reactions  . Ivp Dye [Iodinated Diagnostic Agents]   . Morphine Nausea Only  . Oxycodone Nausea Only    Weight loss  . Trazodone And Nefazodone Other (See Comments)    Pt reports vision changes    Social History   Social History  . Marital status: Divorced    Spouse name: N/A  . Number of children: N/A  . Years of education: N/A   Occupational History  . Not on file.   Social History Main Topics  . Smoking status: Current Every Day Smoker  . Smokeless tobacco: Never Used  . Alcohol use Not on file  . Drug use: Unknown  . Sexual activity: Not on file   Other Topics Concern  . Not on file   Social  History Narrative  . No narrative on file    No family history on file.  BP (!) 142/85   Pulse 82   Ht 6' (1.829 m)   Wt 202 lb (91.6 kg)   BMI 27.40 kg/m   Review of Systems: See HPI above.     Objective:  Physical Exam:  Gen: NAD, comfortable in exam room  Left knee - not repeated today: No gross deformity, ecchymoses, effusion. Mild TTP medial joint line, post patellar facets.  No other tenderness. FROM. Negative ant/post drawers. Negative valgus/varus testing. Negative lachmanns. Negative mcmurrays, apleys, patellar apprehension. NV intact distally.  Right knee: FROM without pain.  Right wrist: No gross deformity, swelling, bruising. Minimal TTP dorsal wrist joint.  No other tenderness hand or wrist. Very mild limitation flexion and extension  compared to left wrist. FROM digits. NVI distally.   Assessment & Plan:  1. Left knee pain - Known DJD confirmed with MRI.  Only mild improvement with cortisone - will look into viscosupplementation.  Tylenol, topical medications, supplements that may help, aleve as needed.  Home exercises.   2. Right wrist pain - chronic.  Radiographs were negative.  Consistent with tendinopathy which is improved currently.  Continue home exercises he learned in OT.

## 2018-10-25 ENCOUNTER — Other Ambulatory Visit: Payer: Self-pay

## 2018-10-25 ENCOUNTER — Inpatient Hospital Stay (HOSPITAL_BASED_OUTPATIENT_CLINIC_OR_DEPARTMENT_OTHER)
Admission: EM | Admit: 2018-10-25 | Discharge: 2018-10-28 | DRG: 639 | Disposition: A | Discharge status: Home / Self Care | Payer: Medicare Other | Attending: Internal Medicine | Admitting: Internal Medicine

## 2018-10-25 ENCOUNTER — Encounter (HOSPITAL_BASED_OUTPATIENT_CLINIC_OR_DEPARTMENT_OTHER): Payer: Self-pay | Admitting: Emergency Medicine

## 2018-10-25 ENCOUNTER — Emergency Department (HOSPITAL_BASED_OUTPATIENT_CLINIC_OR_DEPARTMENT_OTHER): Payer: Medicare Other

## 2018-10-25 DIAGNOSIS — H543 Unqualified visual loss, both eyes: Secondary | ICD-10-CM | POA: Diagnosis not present

## 2018-10-25 DIAGNOSIS — Z9114 Patient's other noncompliance with medication regimen: Secondary | ICD-10-CM

## 2018-10-25 DIAGNOSIS — M21371 Foot drop, right foot: Secondary | ICD-10-CM | POA: Diagnosis present

## 2018-10-25 DIAGNOSIS — H53133 Sudden visual loss, bilateral: Secondary | ICD-10-CM

## 2018-10-25 DIAGNOSIS — R945 Abnormal results of liver function studies: Secondary | ICD-10-CM

## 2018-10-25 DIAGNOSIS — I639 Cerebral infarction, unspecified: Secondary | ICD-10-CM | POA: Diagnosis not present

## 2018-10-25 DIAGNOSIS — G43109 Migraine with aura, not intractable, without status migrainosus: Secondary | ICD-10-CM | POA: Diagnosis present

## 2018-10-25 DIAGNOSIS — F17210 Nicotine dependence, cigarettes, uncomplicated: Secondary | ICD-10-CM | POA: Diagnosis present

## 2018-10-25 DIAGNOSIS — H538 Other visual disturbances: Secondary | ICD-10-CM | POA: Diagnosis present

## 2018-10-25 DIAGNOSIS — R7989 Other specified abnormal findings of blood chemistry: Secondary | ICD-10-CM

## 2018-10-25 DIAGNOSIS — Z888 Allergy status to other drugs, medicaments and biological substances status: Secondary | ICD-10-CM | POA: Diagnosis not present

## 2018-10-25 DIAGNOSIS — E1165 Type 2 diabetes mellitus with hyperglycemia: Secondary | ICD-10-CM | POA: Diagnosis present

## 2018-10-25 DIAGNOSIS — R29818 Other symptoms and signs involving the nervous system: Secondary | ICD-10-CM

## 2018-10-25 DIAGNOSIS — Z885 Allergy status to narcotic agent status: Secondary | ICD-10-CM | POA: Diagnosis not present

## 2018-10-25 DIAGNOSIS — Z7984 Long term (current) use of oral hypoglycemic drugs: Secondary | ICD-10-CM

## 2018-10-25 DIAGNOSIS — E7800 Pure hypercholesterolemia, unspecified: Secondary | ICD-10-CM | POA: Diagnosis not present

## 2018-10-25 DIAGNOSIS — I10 Essential (primary) hypertension: Secondary | ICD-10-CM | POA: Diagnosis not present

## 2018-10-25 DIAGNOSIS — E876 Hypokalemia: Secondary | ICD-10-CM | POA: Diagnosis not present

## 2018-10-25 DIAGNOSIS — H547 Unspecified visual loss: Secondary | ICD-10-CM

## 2018-10-25 DIAGNOSIS — B182 Chronic viral hepatitis C: Secondary | ICD-10-CM | POA: Diagnosis not present

## 2018-10-25 DIAGNOSIS — E1136 Type 2 diabetes mellitus with diabetic cataract: Secondary | ICD-10-CM | POA: Diagnosis present

## 2018-10-25 DIAGNOSIS — M21372 Foot drop, left foot: Secondary | ICD-10-CM | POA: Diagnosis not present

## 2018-10-25 DIAGNOSIS — Z7982 Long term (current) use of aspirin: Secondary | ICD-10-CM | POA: Diagnosis not present

## 2018-10-25 DIAGNOSIS — E1100 Type 2 diabetes mellitus with hyperosmolarity without nonketotic hyperglycemic-hyperosmolar coma (NKHHC): Secondary | ICD-10-CM | POA: Diagnosis not present

## 2018-10-25 DIAGNOSIS — Z91041 Radiographic dye allergy status: Secondary | ICD-10-CM | POA: Diagnosis not present

## 2018-10-25 DIAGNOSIS — G894 Chronic pain syndrome: Secondary | ICD-10-CM | POA: Diagnosis not present

## 2018-10-25 DIAGNOSIS — K760 Fatty (change of) liver, not elsewhere classified: Secondary | ICD-10-CM | POA: Diagnosis not present

## 2018-10-25 DIAGNOSIS — E785 Hyperlipidemia, unspecified: Secondary | ICD-10-CM | POA: Diagnosis not present

## 2018-10-25 DIAGNOSIS — R739 Hyperglycemia, unspecified: Secondary | ICD-10-CM

## 2018-10-25 HISTORY — DX: Type 2 diabetes mellitus without complications: E11.9

## 2018-10-25 HISTORY — DX: Pure hypercholesterolemia, unspecified: E78.00

## 2018-10-25 LAB — APTT: aPTT: 26 seconds (ref 24–36)

## 2018-10-25 LAB — TROPONIN I: Troponin I: <0.03 ng/mL (ref <0.03)

## 2018-10-25 LAB — CBC
HCT: 49.7 % (ref 39.0–52.0)
HEMOGLOBIN: 16.6 g/dL (ref 13.0–17.0)
MCH: 30 pg (ref 26.0–34.0)
MCHC: 33.4 g/dL (ref 30.0–36.0)
MCV: 89.9 fL (ref 80.0–100.0)
Platelets: 150 10*3/uL (ref 150–400)
RBC: 5.53 MIL/uL (ref 4.22–5.81)
RDW: 12.1 % (ref 11.5–15.5)
WBC: 6.3 10*3/uL (ref 4.0–10.5)
nRBC: 0 % (ref 0.0–0.2)

## 2018-10-25 LAB — CBG MONITORING, ED
GLUCOSE-CAPILLARY: 368 mg/dL — AB (ref 70–99)
Glucose-Capillary: >600 mg/dL (ref 70–99)

## 2018-10-25 LAB — GLUCOSE, CAPILLARY: Glucose-Capillary: 344 mg/dL — ABNORMAL HIGH (ref 70–99)

## 2018-10-25 LAB — DIFFERENTIAL
ABS IMMATURE GRANULOCYTES: 0.03 10*3/uL (ref 0.00–0.07)
BASOS ABS: 0 10*3/uL (ref 0.0–0.1)
Basophils Relative: 1 %
Eosinophils Absolute: 0 10*3/uL (ref 0.0–0.5)
Eosinophils Relative: 0 %
Immature Granulocytes: 1 %
Lymphocytes Relative: 22 %
Lymphs Abs: 1.4 10*3/uL (ref 0.7–4.0)
Monocytes Absolute: 0.5 10*3/uL (ref 0.1–1.0)
Monocytes Relative: 8 %
NEUTROS ABS: 4.4 10*3/uL (ref 1.7–7.7)
Neutrophils Relative %: 68 %

## 2018-10-25 LAB — COMPREHENSIVE METABOLIC PANEL
ALT: 151 U/L — ABNORMAL HIGH (ref 0–44)
ANION GAP: 18 — AB (ref 5–15)
AST: 147 U/L — ABNORMAL HIGH (ref 15–41)
Albumin: 3.6 g/dL (ref 3.5–5.0)
Alkaline Phosphatase: 157 U/L — ABNORMAL HIGH (ref 38–126)
BUN: 9 mg/dL (ref 8–23)
CO2: 20 mmol/L — ABNORMAL LOW (ref 22–32)
Calcium: 8.5 mg/dL — ABNORMAL LOW (ref 8.9–10.3)
Chloride: 96 mmol/L — ABNORMAL LOW (ref 98–111)
Creatinine, Ser: 0.95 mg/dL (ref 0.61–1.24)
GFR calc Af Amer: >60 mL/min (ref >60)
GFR calc non Af Amer: >60 mL/min (ref >60)
Glucose, Bld: 629 mg/dL (ref 70–99)
Potassium: 3.2 mmol/L — ABNORMAL LOW (ref 3.5–5.1)
Sodium: 134 mmol/L — ABNORMAL LOW (ref 135–145)
Total Bilirubin: 1.2 mg/dL (ref 0.3–1.2)
Total Protein: 7.8 g/dL (ref 6.5–8.1)

## 2018-10-25 LAB — PROTIME-INR
INR: 0.94
Prothrombin Time: 12.5 seconds (ref 11.4–15.2)

## 2018-10-25 MED ORDER — DIPHENHYDRAMINE HCL 50 MG/ML IJ SOLN
25.0000 mg | Freq: Once | INTRAMUSCULAR | Status: AC
  Administered 2018-10-25: 25 mg via INTRAVENOUS
  Filled 2018-10-25: qty 1

## 2018-10-25 MED ORDER — ENOXAPARIN SODIUM 40 MG/0.4ML ~~LOC~~ SOLN: 40.0000 mg | SUBCUTANEOUS | Status: DC

## 2018-10-25 MED ORDER — PROCHLORPERAZINE EDISYLATE 10 MG/2ML IJ SOLN
10.0000 mg | Freq: Once | INTRAMUSCULAR | Status: AC
  Administered 2018-10-25: 10 mg via INTRAVENOUS
  Filled 2018-10-25: qty 2

## 2018-10-25 MED ORDER — STROKE: EARLY STAGES OF RECOVERY BOOK: Freq: Once | Status: AC

## 2018-10-25 MED ORDER — SODIUM CHLORIDE 0.9 % IV SOLN
INTRAVENOUS | Status: AC
  Administered 2018-10-26 (×3): via INTRAVENOUS

## 2018-10-25 MED ORDER — SODIUM CHLORIDE 0.9 % IV BOLUS
1000.0000 mL | Freq: Once | INTRAVENOUS | Status: AC
  Administered 2018-10-25: 1000 mL via INTRAVENOUS

## 2018-10-25 MED ORDER — ASPIRIN 81 MG PO CHEW
324.0000 mg | CHEWABLE_TABLET | Freq: Once | ORAL | Status: AC
  Administered 2018-10-25: 324 mg via ORAL
  Filled 2018-10-25: qty 4

## 2018-10-25 MED ORDER — ACETAMINOPHEN 160 MG/5ML PO SOLN: 650.0000 mg | ORAL | Status: DC | PRN

## 2018-10-25 MED ORDER — ASPIRIN 325 MG PO TABS
325.0000 mg | ORAL_TABLET | Freq: Every day | ORAL | Status: DC
  Administered 2018-10-26: 325 mg via ORAL
  Filled 2018-10-25: qty 1

## 2018-10-25 MED ORDER — ASPIRIN 300 MG RE SUPP: 300.0000 mg | Freq: Every day | RECTAL | Status: DC

## 2018-10-25 MED ORDER — STROKE: EARLY STAGES OF RECOVERY BOOK: Freq: Once | Status: DC

## 2018-10-25 MED ORDER — ACETAMINOPHEN 325 MG PO TABS: 650.0000 mg | ORAL_TABLET | ORAL | Status: DC | PRN

## 2018-10-25 MED ORDER — ACETAMINOPHEN 650 MG RE SUPP: 650.0000 mg | RECTAL | Status: DC | PRN

## 2018-10-25 MED ORDER — INSULIN ASPART 100 UNIT/ML ~~LOC~~ SOLN
0.0000 [IU] | SUBCUTANEOUS | Status: DC
  Administered 2018-10-26: 1 [IU] via SUBCUTANEOUS
  Administered 2018-10-26 (×3): 9 [IU] via SUBCUTANEOUS
  Administered 2018-10-27: 2 [IU] via SUBCUTANEOUS
  Administered 2018-10-27: 9 [IU] via SUBCUTANEOUS
  Administered 2018-10-27: 5 [IU] via SUBCUTANEOUS
  Administered 2018-10-27: 9 [IU] via SUBCUTANEOUS
  Administered 2018-10-28: 5 [IU] via SUBCUTANEOUS

## 2018-10-25 MED ORDER — INSULIN REGULAR HUMAN 100 UNIT/ML IJ SOLN
10.0000 [IU] | Freq: Once | INTRAMUSCULAR | Status: AC
  Administered 2018-10-25: 10 [IU] via INTRAVENOUS
  Filled 2018-10-25: qty 1

## 2018-10-25 NOTE — ED Notes (Signed)
[facility] notified (Taryn) - patient ready for transport 

## 2018-10-25 NOTE — ED Notes (Signed)
ED Provider at bedside. 

## 2018-10-25 NOTE — ED Notes (Signed)
Pt vomiting in lobby- emesis bag provided. Informed pt and family they would be pulled back to treatment room as soon as another room was cleaned.

## 2018-10-25 NOTE — H&P (Signed)
History and Physical    Xavier Kennedy ZDG:[id number] DOB: 03/16/1956 DOA: 10/25/2018  PCP: Patria ManeGassemi, Mike, MD  Patient coming from: Home.  Chief Complaint: Visual loss.  HPI: Xavier Kennedy is a 63 y.o. male with history of hypertension, diabetes mellitus, hepatitis C presents to the ER with complaints of visual loss mostly on the right side visual field for the last 1 week.  Also has benign left temporal headache at the same time.  Denies any nausea vomiting chest pain abdominal pain or shortness of breath fever chills.  Denies loss of consciousness.  States he has been compliant with his medications.  Admits to smoking cigarettes denies any alcohol or drug abuse.  ED Course: In the ER patient has been noticed to have poor vision mostly in the right visual field.  Able to move all extremities.  CT head was showing possibility of a right frontal infarct.  Patient blood sugar is 29 with anion gap of 18.  Patient's LFTs AST 147 ALT 151 total bilirubin was 1.2.  She was given 1 L normal saline bolus.  10 units of NovoLog was given for the hyperglycemia uncontrolled diabetes.  Following which blood sugar improved to 400s.  On-call neurologist was consulted for possible stroke.  Patient admitted for further work-up of visual loss and uncontrolled diabetes.  In addition patient is found to have elevated LFTs more than his baseline.  Review of Systems: As per HPI, rest all negative.   Past Medical History:  Diagnosis Date  . Back pain   . Diabetes mellitus without complication (HCC)   . Hepatitis C   . High cholesterol   . Hypertension     Past Surgical History:  Procedure Laterality Date  . BACK SURGERY    . HIP SURGERY    . SHOULDER SURGERY       reports that he has been smoking cigarettes. He has been smoking about 0.50 packs per day. He has never used smokeless tobacco. He reports that he does not drink alcohol or use drugs.  Allergies  Allergen Reactions  . Ivp Dye [Iodinated  Diagnostic Agents]   . Morphine Nausea Only  . Oxycodone Nausea Only    Weight loss  . Trazodone And Nefazodone Other (See Comments)    Pt reports vision changes    History reviewed. No pertinent family history.  Prior to Admission medications   Medication Sig Start Date End Date Taking? Authorizing Provider  amLODipine (NORVASC) 10 MG tablet Take 10 mg by mouth daily.    [provider]  aspirin 81 MG tablet Take 81 mg by mouth daily.    [provider]  clonazePAM (KLONOPIN) 2 MG tablet Take 2 mg by mouth at bedtime.    [provider]  gabapentin (NEURONTIN) 300 MG capsule 1 CAP AT BEDTIME X5DAYS, THEN INCREASE TO 1 TWICE A DAY X5DAYS, THEN 1 CAP 3 TIMES A DAY 02/05/17   [provider]  lisinopril (PRINIVIL,ZESTRIL) 20 MG tablet Take 20 mg by mouth daily.    [provider]  metFORMIN (GLUCOPHAGE) 500 MG tablet Take 500 mg by mouth 2 (two) times daily with a meal.    [provider]  NUCYNTA 75 MG tablet Take 75 mg by mouth 3 (three) times daily as needed. 03/08/17   [provider]  potassium chloride (K-DUR) 10 MEQ tablet Take 10 mEq by mouth daily.    [provider]  predniSONE (DELTASONE) 10 MG tablet Take 2 tablets (20  mg total) by mouth 2 (two) times daily. 06/19/14   Geoffery Lyonselo, Douglas, MD    Physical Exam: Vitals:   10/25/18 1730 10/25/18 1800 10/25/18 1830 10/25/18 2156  BP: (!) 168/97 (!) 172/98 (!) 158/73 (!) 158/86  Pulse: 100 86 100 98  Resp: (!) 22 (!) 21 19 20   Temp:    99.2 F (37.3 C)  TempSrc:    Oral  SpO2: 100% 95% 97% 98%  Weight:    90.7 kg  Height:    6' (1.829 m)      Constitutional: Moderately built and nourished. Vitals:   10/25/18 1730 10/25/18 1800 10/25/18 1830 10/25/18 2156  BP: (!) 168/97 (!) 172/98 (!) 158/73 (!) 158/86  Pulse: 100 86 100 98  Resp: (!) 22 (!) 21 19 20   Temp:    99.2 F (37.3 C)  TempSrc:    Oral  SpO2: 100% 95% 97% 98%  Weight:    90.7 kg  Height:     6' (1.829 m)   Eyes: Anicteric no pallor. ENMT: No discharge from the ears eyes nose and mouth. Neck: No JVD appreciated no mass felt. Respiratory: No rhonchi or crepitations. Cardiovascular: S1-S2 heard. Abdomen: Soft nontender bowel sounds present. Musculoskeletal: No edema.  No joint effusion. Skin: No rash. Neurologic: Alert awake oriented to time place and person.  Has poor vision in both eyes mostly on the right side.  Pupils are equal and reacting to light.  No facial asymmetry tongue is midline. Psychiatric: Appears normal.   Labs on Admission: I have personally reviewed following labs and imaging studies  CBC: Recent Labs  Lab 10/25/18 1625  WBC 6.3  NEUTROABS 4.4  HGB 16.6  HCT 49.7  MCV 89.9  PLT 150   Basic Metabolic Panel: Recent Labs  Lab 10/25/18 1625  NA 134*  K 3.2*  CL 96*  CO2 20*  GLUCOSE 629*  BUN 9  CREATININE 0.95  CALCIUM 8.5*   GFR: Estimated Creatinine Clearance: 87.4 mL/min (by C-G formula based on SCr of 0.95 mg/dL). Liver Function Tests: Recent Labs  Lab 10/25/18 1625  AST 147*  ALT 151*  ALKPHOS 157*  BILITOT 1.2  PROT 7.8  ALBUMIN 3.6   No results for input(s): LIPASE, AMYLASE in the last 168 hours. No results for input(s): AMMONIA in the last 168 hours. Coagulation Profile: Recent Labs  Lab 10/25/18 1625  INR 0.94   Cardiac Enzymes: Recent Labs  Lab 10/25/18 1625  TROPONINI <0.03   BNP (last 3 results) No results for input(s): PROBNP in the last 8760 hours. HbA1C: No results for input(s): HGBA1C in the last 72 hours. CBG: Recent Labs  Lab 10/25/18 1647 10/25/18 1928 10/25/18 2212  GLUCAP >600* 368* 344*   Lipid Profile: No results for input(s): CHOL, HDL, LDLCALC, TRIG, CHOLHDL, LDLDIRECT in the last 72 hours. Thyroid Function Tests: No results for input(s): TSH, T4TOTAL, FREET4, T3FREE, THYROIDAB in the last 72 hours. Anemia Panel: No results for input(s): VITAMINB12, FOLATE, FERRITIN, TIBC, IRON,  RETICCTPCT in the last 72 hours. Urine analysis: No results found for: COLORURINE, APPEARANCEUR, LABSPEC, PHURINE, GLUCOSEU, HGBUR, BILIRUBINUR, KETONESUR, PROTEINUR, UROBILINOGEN, NITRITE, LEUKOCYTESUR Sepsis Labs: @LABRCNTIP (procalcitonin:4,lacticidven:4) )No results found for this or any previous visit (from the past 240 hour(s)).   Radiological Exams on Admission: Ct Head Wo Contrast  Result Date: 10/25/2018 CLINICAL DATA:  Severe headache, lost vision 1 week ago, cough, nausea, vomiting, history diabetes mellitus, hypertension EXAM: CT HEAD WITHOUT CONTRAST TECHNIQUE: Contiguous axial images were obtained from the  base of the skull through the vertex without intravenous contrast. Sagittal and coronal MPR images reconstructed from axial data set. COMPARISON:  None. FINDINGS: Brain: Generalized atrophy. Normal ventricular morphology. No midline shift or mass effect. Small vessel chronic ischemic changes of deep cerebral white matter. Question subcortical white matter infarct and RIGHT frontal lobe versus pronounced white matter disease. No intracranial hemorrhage, mass lesion or additional evidence of infarction. No extra-axial fluid collections. Vascular: Atherosclerotic calcifications of internal carotid arteries bilaterally at skull base Skull: Calvaria intact. Incomplete posterior arch C1, developmental variant. Sinuses/Orbits: Mucosal thickening LEFT maxillary sinus. Other: N/A IMPRESSION: Atrophy with small vessel chronic ischemic changes of deep cerebral white matter. 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 No additional intracranial abnormalities. Electronically Signed   By: Ulyses Southward M.D.   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 Blurred vision in both eyes with headache -differentials include possible stroke temporal arteritis and also hyperglycemia with possible retinopathy causing the symptoms.  Sed rate CRP MRI brain has been ordered.  Appreciate neurology consult. 2. Uncontrolled diabetes mellitus type 2 with severe hyperglycemia anion gap is mildly elevated at admission -patient was given NovoLog insulin 10 units.  Presently blood sugars in the 400s.  I am ordering repeat basic metabolic panel and anion gap if is still elevated will start patient on insulin infusion.  Otherwise we will keep patient on sliding scale coverage with Lantus.  Check hemoglobin A1c.  Continue hydration. 3. Hypertension on lisinopril and amlodipine. 4. Elevated LFTs more than the baseline reviewed patient's previous LFTs in care everywhere.  Does have history of hepatitis C.  Will repeat LFTs along with lipase Tylenol level sonogram of the abdomen. 5. History of chronic hepatitis C.   DVT prophylaxis: SCDs for now if in case patient needs temporal artery biopsy. Code Status: Full code. Family Communication: No family at the bedside. Disposition Plan: Home. Consults called: Neurology. Admission status: Observation.   Eduard Clos MD [facility] Pager [phone].  If 7PM-7AM, please contact night-coverage [email or web address] Password [facility]  10/25/2018, 11:17 PM

## 2018-10-25 NOTE — ED Notes (Signed)
Pt sister was called and left a voice mail about pt going to [facility].

## 2018-10-25 NOTE — ED Notes (Signed)
Pt has been taken to the CT scanner after getting undressed. EKG and lab work to be completed when pt returns.

## 2018-10-25 NOTE — ED Triage Notes (Addendum)
Pt sts he has had a severe HA and lost his vision 1 week ago today. Sts he can only see colors.  Cough. Nausea and vomiting. Has not been seen medically until now.

## 2018-10-25 NOTE — ED Provider Notes (Signed)
[facility] EMERGENCY DEPARTMENT Provider Note   CSN: [id number] Arrival date & time: 10/25/18  1519     History   Chief Complaint Chief Complaint  Patient presents with  . Loss of Vision    HPI Xavier Kennedy is a 63 y.o. male.  HPI   63yo male with history of DM, htn, hepatitis C who presents with concern for vision loss for one week.  Occurred suddenly. THought it would improve but it has not. Reports both eyes have vision loss, missing pieces of vision, but it is difficult to describe.  No double vision. No numbness, weakness, facial droop, trouble talking.  No other recent illness. Denies ingestions.  Past Medical History:  Diagnosis Date  . Back pain   . Diabetes mellitus without complication (HCC)   . Hepatitis C   . High cholesterol   . Hypertension     Patient Active Problem List   Diagnosis Date Noted  . 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 Uncontrolled type 2 diabetes mellitus with hyperglycemia (HCC) 10/25/2018  . Blurred vision 10/25/2018  . Chronic hepatitis C virus infection (HCC) 02/11/2017  . Left knee pain 02/11/2017  . Right wrist pain 02/11/2017  . Anxiety 07/27/2016  . Ventral hernia without obstruction or gangrene 01/03/2016  . Risk for falls 09/04/2015  . Hyperlipidemia 07/19/2014  . Hx of bilateral hip replacements 04/13/2014  . Erectile dysfunction 04/13/2014  . Type II diabetes mellitus (HCC) 12/29/2013  . Postlaminectomy syndrome, lumbar region 12/29/2013  . Insomnia 12/29/2013  . Essential hypertension 12/29/2013  . Depressive disorder 12/29/2013  . Foot drop, bilateral 08/09/2013  . Fusion of spine, thoracolumbar region 05/11/2013  . S/P spinal fusion 03/22/2013  . 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 Bipolar I disorder, single manic episode (HCC) 02/18/2013  . Sacroiliac joint pain 09/15/2012  . Use of cane as ambulatory aid 02/05/2012  . Chronic pain  syndrome 07/12/2011  . Tobacco use 06/11/2011  . Hepatitis C carrier (HCC) 06/11/2011    Past Surgical History:  Procedure Laterality Date  . BACK SURGERY    . HIP SURGERY    . SHOULDER SURGERY          Home Medications    Prior to Admission medications   Medication Sig Start Date End Date Taking? Authorizing Provider  amLODipine (NORVASC) 10 MG tablet Take 10 mg by mouth daily.   Yes [provider]  aspirin 81 MG tablet Take 81 mg by mouth daily.   Yes [provider]  DULoxetine (CYMBALTA) 30 MG capsule Take 30 mg by mouth at bedtime as needed (mood).  08/29/18  Yes [provider]  lisinopril (PRINIVIL,ZESTRIL) 20 MG tablet Take 20 mg by mouth daily.   Yes [provider]  metFORMIN (GLUCOPHAGE) 500 MG tablet Take 500 mg by mouth 2 (two) times daily as needed (high blood sugar/not feeling well).    Yes [provider]  NUCYNTA 75 MG tablet Take 75 mg by mouth every 12 (twelve) hours as needed for moderate pain.  03/08/17  Yes [provider]  potassium chloride (K-DUR) 10 MEQ tablet Take 10 mEq by mouth daily as needed (low potassium).    Yes [provider]    Family History History reviewed. No pertinent family history.  Social History Social History   Tobacco Use  . Smoking status: Current Every Day Smoker    Packs/day: 0.50    Types: Cigarettes  . Smokeless  tobacco: Never Used  Substance Use Topics  . Alcohol use: Never    Frequency: Never  . Drug use: Never     Allergies   Gabapentin; Ivp dye [iodinated diagnostic agents]; Morphine; Oxycodone; and Trazodone and nefazodone   Review of Systems Review of Systems  Constitutional: Negative for fever.  HENT: Negative for sore throat.   Eyes: Positive for visual disturbance.  Respiratory: Negative for shortness of breath.   Cardiovascular: Negative for chest pain.  Gastrointestinal: Negative for abdominal pain and nausea.  Genitourinary: Negative  for difficulty urinating.  Musculoskeletal: Negative for back pain and neck stiffness.  Skin: Negative for rash.  Neurological: Positive for headaches. Negative for syncope, facial asymmetry, speech difficulty, weakness and numbness.     Physical Exam Updated Vital Signs BP (!) 156/86   Pulse 91   Temp 98.2 F (36.8 C) (Oral)   Resp 19   Ht 6' (1.829 m)   Wt 90.7 kg   SpO2 100%   BMI 27.12 kg/m   Physical Exam Vitals signs and nursing note reviewed.  Constitutional:      General: He is not in acute distress.    Appearance: He is well-developed. He is not diaphoretic.  HENT:     Head: Normocephalic and atraumatic.  Eyes:     General: Visual field deficit present.     Conjunctiva/sclera: Conjunctivae normal.  Neck:     Musculoskeletal: Normal range of motion.  Cardiovascular:     Rate and Rhythm: Normal rate and regular rhythm.     Heart sounds: Normal heart sounds. No murmur. No friction rub. No gallop.   Pulmonary:     Effort: Pulmonary effort is normal. No respiratory distress.     Breath sounds: Normal breath sounds. No wheezing or rales.  Abdominal:     General: There is no distension.     Palpations: Abdomen is soft.     Tenderness: There is no abdominal tenderness. There is no guarding.  Skin:    General: Skin is warm and dry.  Neurological:     Mental Status: He is alert and oriented to person, place, and time.     Cranial Nerves: No cranial nerve deficit, dysarthria or facial asymmetry.     Sensory: Sensation is intact.     Motor: Motor function is intact.     Coordination: Coordination abnormal (difficult to assess given visual changes).     Comments: Vision difficult to assess, reports sees portions of my face,appears to have right visual field deficit      ED Treatments / Results  Labs (all labs ordered are listed, but only abnormal results are displayed) Labs Reviewed  COMPREHENSIVE METABOLIC PANEL - Abnormal; Notable for the following components:       Result Value   Sodium 134 (*)    Potassium 3.2 (*)    Chloride 96 (*)    CO2 20 (*)    Glucose, Bld 629 (*)    Calcium 8.5 (*)    AST 147 (*)    ALT 151 (*)    Alkaline Phosphatase 157 (*)    Anion gap 18 (*)    All other components within normal limits  GLUCOSE, CAPILLARY - Abnormal; Notable for the following components:   Glucose-Capillary 344 (*)    All other components within normal limits  SEDIMENTATION RATE - Abnormal; Notable for the following components:   Sed Rate 35 (*)    All other components within normal limits  C-REACTIVE PROTEIN - Abnormal;  Notable for the following components:   CRP 1.2 (*)    All other components within normal limits  LIPID PANEL - Abnormal; Notable for the following components:   HDL 26 (*)    All other components within normal limits  CBC - Abnormal; Notable for the following components:   Platelets 130 (*)    All other components within normal limits  BASIC METABOLIC PANEL - Abnormal; Notable for the following components:   Potassium 2.9 (*)    Glucose, Bld 402 (*)    BUN 5 (*)    Calcium 7.9 (*)    All other components within normal limits  GLUCOSE, CAPILLARY - Abnormal; Notable for the following components:   Glucose-Capillary 352 (*)    All other components within normal limits  GLUCOSE, CAPILLARY - Abnormal; Notable for the following components:   Glucose-Capillary 124 (*)    All other components within normal limits  ACETAMINOPHEN LEVEL - Abnormal; Notable for the following components:   Acetaminophen (Tylenol), Serum <10 (*)    All other components within normal limits  HEPATIC FUNCTION PANEL - Abnormal; Notable for the following components:   Albumin 3.1 (*)    AST 143 (*)    ALT 134 (*)    Bilirubin, Direct 0.3 (*)    All other components within normal limits  BASIC METABOLIC PANEL - Abnormal; Notable for the following components:   Potassium 2.8 (*)    CO2 33 (*)    Glucose, Bld 138 (*)    BUN <5 (*)    Calcium  8.1 (*)    All other components within normal limits  GLUCOSE, CAPILLARY - Abnormal; Notable for the following components:   Glucose-Capillary 356 (*)    All other components within normal limits  CBG MONITORING, ED - Abnormal; Notable for the following components:   Glucose-Capillary >600 (*)    All other components within normal limits  CBG MONITORING, ED - Abnormal; Notable for the following components:   Glucose-Capillary 368 (*)    All other components within normal limits  PROTIME-INR  APTT  CBC  DIFFERENTIAL  TROPONIN I  HIV ANTIBODY (ROUTINE TESTING W REFLEX)  RAPID URINE DRUG SCREEN, HOSP PERFORMED  LIPASE, BLOOD  HEMOGLOBIN A1C  HEPATITIS PANEL, ACUTE    EKG None  Radiology Ct Head Wo Kennedy  Result Date: 10/25/2018 CLINICAL DATA:  Severe headache, lost vision 1 week ago, cough, nausea, vomiting, history diabetes mellitus, hypertension EXAM: CT HEAD WITHOUT Kennedy TECHNIQUE: Contiguous axial images were obtained from the base of the skull through the vertex without intravenous Kennedy. Sagittal and coronal MPR images reconstructed from axial data set. COMPARISON:  None. FINDINGS: Xavier: Generalized atrophy. Normal ventricular morphology. No midline shift or mass effect. Small vessel chronic ischemic changes of deep cerebral white matter. Question subcortical white matter infarct and RIGHT frontal lobe versus pronounced white matter disease. No intracranial hemorrhage, mass lesion or additional evidence of infarction. No extra-axial fluid collections. Vascular: Atherosclerotic calcifications of internal carotid arteries bilaterally at skull base Skull: Calvaria intact. Incomplete posterior arch C1, developmental variant. Sinuses/Orbits: Mucosal thickening LEFT maxillary sinus. Other: N/A IMPRESSION: Atrophy with small vessel chronic ischemic changes of deep cerebral white matter. 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 No additional intracranial  abnormalities. Electronically Signed   By: Ulyses Southward M.D.   On: 10/25/2018 16:22   Mr Xavier Kennedy Head Wo Kennedy  Result Date: 10/26/2018 CLINICAL DATA:  Stroke follow-up. Headache and visual loss for 5-7 days. EXAM: MRI  HEAD WITHOUT Kennedy MRA HEAD WITHOUT Kennedy TECHNIQUE: Multiplanar, multiecho pulse sequences of the Xavier and surrounding structures were obtained without intravenous Kennedy. Angiographic images of the head were obtained using MRA technique without Kennedy. COMPARISON:  Head CT 10/25/2018 FINDINGS: MRI HEAD FINDINGS Xavier: There is no acute infarct, acute hemorrhage or mass effect. The midline structures are normal. Multifocal white matter hyperintensity, most commonly due to chronic ischemic microangiopathy. Generalized atrophy without lobar predilection. Susceptibility-sensitive sequences show no chronic microhemorrhage or superficial siderosis. SKULL AND UPPER CERVICAL SPINE: The visualized skull base, calvarium, upper cervical spine and extracranial soft tissues are normal. SINUSES/ORBITS: No fluid levels or advanced mucosal thickening. No mastoid or middle ear effusion. The orbits are normal. MRA HEAD FINDINGS POSTERIOR CIRCULATION: --Basilar artery: Normal. --Posterior cerebral arteries: Normal. Right PCA is partially supplied by the right P-comm. --Superior cerebellar arteries: Normal. --Inferior cerebellar arteries: Normal anterior and posterior inferior cerebellar arteries. ANTERIOR CIRCULATION: --Intracranial internal carotid arteries: Normal. --Anterior cerebral arteries: Normal. Both A1 segments are present. Patent anterior communicating artery. --Middle cerebral arteries: Normal. --Posterior communicating arteries: Present on the right, absent on the left. --Vertebral arteries: Left dominant.  Patent. IMPRESSION: 1. No acute intracranial abnormality. 2. Multifocal white matter disease most consistent with chronic ischemic microangiopathy. 3. Normal intracranial MRA.  Electronically Signed   By: Deatra Robinson M.D.   On: 10/26/2018 04:29   Mr Xavier Kennedy  Result Date: 10/26/2018 CLINICAL DATA:  Stroke follow-up. Headache and visual loss for 5-7 days. EXAM: MRI HEAD WITHOUT Kennedy MRA HEAD WITHOUT Kennedy TECHNIQUE: Multiplanar, multiecho pulse sequences of the Xavier and surrounding structures were obtained without intravenous Kennedy. Angiographic images of the head were obtained using MRA technique without Kennedy. COMPARISON:  Head CT 10/25/2018 FINDINGS: MRI HEAD FINDINGS Xavier: There is no acute infarct, acute hemorrhage or mass effect. The midline structures are normal. Multifocal white matter hyperintensity, most commonly due to chronic ischemic microangiopathy. Generalized atrophy without lobar predilection. Susceptibility-sensitive sequences show no chronic microhemorrhage or superficial siderosis. SKULL AND UPPER CERVICAL SPINE: The visualized skull base, calvarium, upper cervical spine and extracranial soft tissues are normal. SINUSES/ORBITS: No fluid levels or advanced mucosal thickening. No mastoid or middle ear effusion. The orbits are normal. MRA HEAD FINDINGS POSTERIOR CIRCULATION: --Basilar artery: Normal. --Posterior cerebral arteries: Normal. Right PCA is partially supplied by the right P-comm. --Superior cerebellar arteries: Normal. --Inferior cerebellar arteries: Normal anterior and posterior inferior cerebellar arteries. ANTERIOR CIRCULATION: --Intracranial internal carotid arteries: Normal. --Anterior cerebral arteries: Normal. Both A1 segments are present. Patent anterior communicating artery. --Middle cerebral arteries: Normal. --Posterior communicating arteries: Present on the right, absent on the left. --Vertebral arteries: Left dominant.  Patent. IMPRESSION: 1. No acute intracranial abnormality. 2. Multifocal white matter disease most consistent with chronic ischemic microangiopathy. 3. Normal intracranial MRA. Electronically Signed   By:  Deatra Robinson M.D.   On: 10/26/2018 04:29   Xavier Kennedy  Result Date: 10/26/2018 CLINICAL DATA:  63 year old male with elevated LFTs. Patient with hepatitis-C and diabetes. EXAM: ABDOMEN ULTRASOUND Kennedy COMPARISON:  01/18/2015 ultrasound FINDINGS: Gallbladder: Mild gallbladder wall thickening is unchanged. There is no evidence of cholelithiasis, sonographic Murphy sign or pericholecystic fluid. Common bile duct: Diameter: 3 mm. No intrahepatic or extrahepatic biliary dilatation. Liver: Slightly heterogeneous and increased echogenicity of the hepatic echotexture noted. No focal hepatic abnormalities are identified. Portal vein is patent on color Doppler imaging with normal direction of blood flow towards the liver. IVC: No abnormality visualized. Pancreas: Visualized portion unremarkable. Spleen: Size and appearance  within normal limits. Right Kidney: Length: 10.7 cm. Echogenicity within normal limits. No mass or hydronephrosis visualized. Left Kidney: Length: 10.1 cm. Echogenicity within normal limits. No mass or hydronephrosis visualized. Abdominal aorta: No aneurysm visualized. Other findings: None. IMPRESSION: 1. Slightly heterogeneous and echogenic hepatic echotexture which may represent hepatic steatosis. 2. Mild gallbladder wall thickening again noted without cholelithiasis or other signs of acute cholecystitis, probably related to hepatic dysfunction. 3. No evidence of biliary dilatation. Electronically Signed   By: Harmon Pier M.D.   On: 10/26/2018 11:41   Vas Xavier Carotid  Result Date: 10/26/2018 Carotid Arterial Duplex Study Indications: CVA. Performing Technologist: Jeb Levering RDMS, RVT  Examination Guidelines: A Kennedy evaluation includes B-mode imaging, spectral Doppler, color Doppler, and power Doppler as needed of all accessible portions of each vessel. Bilateral testing is considered an integral part of a Kennedy examination. Limited examinations for reoccurring indications may  be performed as noted.  Right Carotid Findings: +----------+--------+-------+--------+---------------------+-------------------+           PSV cm/sEDV    StenosisDescribe             Comments                              cm/s                                                    +----------+--------+-------+--------+---------------------+-------------------+ CCA Prox  85      7                                                       +----------+--------+-------+--------+---------------------+-------------------+ CCA Distal88      16                                                      +----------+--------+-------+--------+---------------------+-------------------+ ICA Prox  66      12     1-39%   smooth and           atypical appearance                                  homogeneous                              +----------+--------+-------+--------+---------------------+-------------------+ ICA Distal71      18                                                      +----------+--------+-------+--------+---------------------+-------------------+ ECA       130     19                                                      +----------+--------+-------+--------+---------------------+-------------------+ +----------+--------+-------+----------------+-------------------+  PSV cm/sEDV cmsDescribe        Arm Pressure (mmHG) +----------+--------+-------+----------------+-------------------+ [id number]             Multiphasic, WNL                    +----------+--------+-------+----------------+-------------------+ +---------+--------+--------+------+ VertebralPSV cm/sEDV cm/sAbsent +---------+--------+--------+------+  Left Carotid Findings: +----------+--------+--------+--------+------------+--------+           PSV cm/sEDV cm/sStenosisDescribe    Comments +----------+--------+--------+--------+------------+--------+ CCA Prox  99      15                                    +----------+--------+--------+--------+------------+--------+ CCA Distal65      18                                   +----------+--------+--------+--------+------------+--------+ ICA Prox  24      7       1-39%   heterogenous         +----------+--------+--------+--------+------------+--------+ ICA Distal57      15                                   +----------+--------+--------+--------+------------+--------+ ECA       147     21                                   +----------+--------+--------+--------+------------+--------+ +----------+--------+--------+----------------+-------------------+ SubclavianPSV cm/sEDV cm/sDescribe        Arm Pressure (mmHG) +----------+--------+--------+----------------+-------------------+           188             Multiphasic, WNL                    +----------+--------+--------+----------------+-------------------+ +---------+--------+--+--------+--+---------+ VertebralPSV cm/s48EDV cm/s12Antegrade +---------+--------+--+--------+--+---------+  Summary: Right Carotid: Velocities in the right ICA are consistent with a 1-39% stenosis. Left Carotid: Velocities in the left ICA are consistent with a 1-39% stenosis.  *See table(s) above for measurements and observations.     Preliminary     Procedures .Critical Care Performed by: Alvira Monday, MD Authorized by: Alvira Monday, MD   Critical care provider statement:    Critical care time (minutes):  30   Critical care was necessary to treat or prevent imminent or life-threatening deterioration of the following conditions:  CNS failure or compromise   Critical care was time spent personally by me on the following activities:  Discussions with consultants, examination of patient, ordering and performing treatments and interventions, ordering and review of laboratory studies, ordering and review of radiographic studies, obtaining history from patient  or surrogate and review of old charts   (including critical care time)  Medications Ordered in ED Medications   stroke: mapping our early stages of recovery book ( Does not apply Not Given 10/26/18 0008)  0.9 %  sodium chloride infusion ( Intravenous New Bag/Given 10/26/18 1242)  insulin aspart (novoLOG) injection 0-9 Units (9 Units Subcutaneous Given 10/26/18 1216)  amLODipine (NORVASC) tablet 10 mg (10 mg Oral Given 10/26/18 1215)  lisinopril (PRINIVIL,ZESTRIL) tablet 20 mg (20 mg Oral Given 10/26/18 1215)  gabapentin (NEURONTIN) capsule 300 mg (300 mg Oral Given 10/26/18 1215)  insulin glargine (LANTUS) injection 10 Units (10 Units Subcutaneous  Given 10/26/18 1216)  aspirin EC tablet 81 mg (has no administration in time range)  sodium chloride 0.9 % bolus 1,000 mL (0 mLs Intravenous Stopped 10/25/18 1823)  prochlorperazine (COMPAZINE) injection 10 mg (10 mg Intravenous Given 10/25/18 1722)  diphenhydrAMINE (BENADRYL) injection 25 mg (25 mg Intravenous Given 10/25/18 1722)  insulin regular (NOVOLIN R,HUMULIN R) 100 units/mL injection 10 Units (10 Units Intravenous Given 10/25/18 1723)  aspirin chewable tablet 324 mg (324 mg Oral Given 10/25/18 1840)   stroke: mapping our early stages of recovery book ( Does not apply Duplicate 10/26/18 0009)  potassium chloride SA (K-DUR,KLOR-CON) CR tablet 60 mEq (60 mEq Oral Given 10/26/18 0138)  potassium chloride SA (K-DUR,KLOR-CON) CR tablet 40 mEq (40 mEq Oral Given 10/26/18 1421)     Initial Impression / Assessment and Plan / ED Course  I have reviewed the triage vital signs and the nursing notes.  Pertinent labs & imaging results that were available during my care of the patient were reviewed by me and considered in my medical decision making (see chart for details).     63yo male with history of DM, htn, hepatitis C who presents with concern for vision loss for one week.   Labs significant for hyperglycemia and CT significant for possible right frontal  lobe stroke.  DDx includes hyperglycemia, HONK, PRES secondary to hyperglycemia, CVA.  Given IV fluids, insulin. Consulted Neurology. Will transfer to hospitalist admission.   Final Clinical Impressions(s) / ED Diagnoses   Final diagnoses:  Sudden visual loss of both eyes  Hyperglycemia  Cerebrovascular accident (CVA), unspecified mechanism [facility](HCC)    ED Discharge Orders    None       Alvira MondaySchlossman, Treyten Monestime, MD 10/26/18 1443

## 2018-10-25 NOTE — Progress Notes (Signed)
63 y.o. male accepted from [facility] for acute ischemic CVA after presenting with one week of right visual field defects. CT head showed evidence of acute ischemic infarct invovling right frontal lobe. Case discussed with Dr. Laurence Slate of neurology, who recommended transfer to [facility] for additional work-up and management of acute ischemic CVA. Has not had anti-platelet medication at time of my discussion with [facility] ED physician.    Newton Pigg, DO Hospitalist

## 2018-10-25 NOTE — Consult Note (Signed)
Neurology Consultation  Reason for Consult: Vision loss , concern for stroke Referring Physician: Dr. Hal Hope  CC: Vision loss, concern for stroke  History is obtained from: Patient-unreliable historian, chart  HPI: Xavier Kennedy is a 64 y.o. male past medical history of diabetes, hepatitis C, noncompliant to medications, hypertension, hyperlipidemia, presented to [place] emergency room for severe headache and visual loss for 5 to 7 days. 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  Because of these concerning findings and visual loss, he was admitted to [facility] for further work-up. Of note, his blood sugars were nearly 700s at that time. He reports that he been having headaches and visual loss ongoing for 5 to 7 days.  He says that he can barely see light in color. Cannot see shapes properly. Also had some concern for right visual field cut at the center. He is unable to reliably provide me with any history.  No family at bedside for further history taking.   LKW: 5 to 7 days ago tpa given?: no, outside the window Premorbid modified Rankin scale (mRS):0  ROS:  Unable to lively obtain due to altered mental status.   Past Medical History:  Diagnosis Date  . Back pain   . Diabetes mellitus without complication ([place])   . Hepatitis C   . High cholesterol   . Hypertension     No family history on file. Unable to reliably provide  Social History:   reports that he has been smoking cigarettes. He has been smoking about 0.50 packs per day. He has never used smokeless tobacco. He reports that he does not drink alcohol or use drugs. Unable to confirm.  Documented from the chart  Medications  Current Facility-Administered Medications:  .   stroke: mapping our early stages of recovery book, , Does not  apply, Once, Amie Portland, MD .  Derrill Memo ON 10/26/2018] aspirin suppository 300 mg, 300 mg, Rectal, Daily **OR** [START ON 10/26/2018] aspirin tablet 325 mg, 325 mg, Oral, Daily, Amie Portland, MD  Exam: Current vital signs: BP (!) 158/86 (BP Location: Right Arm)   Pulse 98   Temp 99.2 F (37.3 C) (Oral)   Resp 20   Ht 6' (1.829 m)   Wt 90.7 kg   SpO2 98%   BMI 27.12 kg/m  Vital signs in last 24 hours: Temp:  [98.4 F (36.9 C)-99.2 F (37.3 C)] 99.2 F (37.3 C) (02/11 2156) Pulse Rate:  [86-124] 98 (02/11 2156) Resp:  [14-22] 20 (02/11 2156) BP: (155-181)/(73-104) 158/86 (02/11 2156) SpO2:  [95 %-100 %] 98 % (02/11 2156) Weight:  [90.7 kg-96.6 kg] 90.7 kg (02/11 2156) General: Awake alert in no distress HEENT: Normocephalic atraumatic dry mucous membranes Lungs: Clear to auscultation Cardiovascular: S1 to regular rate rhythm Abdomen: Soft nondistended nontender Extremities: Warm well perfused with intact pulses Neurological exam Mental status: Awake alert oriented x3. Speech is non-dysarthric. Naming repetition comprehension and fluency are intact. Cranial nerves: Pupils equal round reactive to light bilaterally, extraocular movements appear intact.  Visual acuity is reduced to light perception in both eyes.  At one point, he said that he could not see the right visual field but on covering each eye separately he denies being able to see any finger movement and can only see light.  Face is symmetric.  Auditory acuity is intact.  Palate midline.  Tongue midline. Motor exam: 5/5 with no vertical drift in any of the extremities. Sensory exam: Intact light touch all over with no extinction Coordination: Finger-nose-finger is intact bilaterally. NIHSS 1a Level of Conscious.: 0 1b LOC Questions: 0 1c LOC Commands: 0 2 Best Gaze: 0 3 Visual: 2 4 Facial Palsy: 0 5a Motor Arm - left: 0 5b Motor Arm - Right: 0 6a Motor Leg - Left: 0 6b Motor Leg - Right: 0 7 Limb Ataxia: 0 8  Sensory: 0 9 Best Language: 0 10 Dysarthria: 0 11 Extinct. and Inatten.: 0 TOTAL: 2   Labs I have reviewed labs in epic and the results pertinent to this consultation are: Initial sugar 629.  CBC    Component Value Date/Time   WBC 6.3 10/25/2018 1625   RBC 5.53 10/25/2018 1625   HGB 16.6 10/25/2018 1625   HCT 49.7 10/25/2018 1625   PLT 150 10/25/2018 1625   MCV 89.9 10/25/2018 1625   MCH 30.0 10/25/2018 1625   MCHC 33.4 10/25/2018 1625   RDW 12.1 10/25/2018 1625   LYMPHSABS 1.4 10/25/2018 1625   MONOABS 0.5 10/25/2018 1625   EOSABS 0.0 10/25/2018 1625   BASOSABS 0.0 10/25/2018 1625    CMP     Component Value Date/Time   NA 134 (L) 10/25/2018 1625   K 3.2 (L) 10/25/2018 1625   CL 96 (L) 10/25/2018 1625   CO2 20 (L) 10/25/2018 1625   GLUCOSE 629 (HH) 10/25/2018 1625   BUN 9 10/25/2018 1625   CREATININE 0.95 10/25/2018 1625   CALCIUM 8.5 (L) 10/25/2018 1625   PROT 7.8 10/25/2018 1625   ALBUMIN 3.6 10/25/2018 1625   AST 147 (H) 10/25/2018 1625   ALT 151 (H) 10/25/2018 1625   ALKPHOS 157 (H) 10/25/2018 1625   BILITOT 1.2 10/25/2018 1625   GFRNONAA >60 10/25/2018 1625   GFRAA >60 10/25/2018 1625    Imaging I have reviewed the images obtained:  CT-scan of the brain-chronic white matter disease and right frontal punctate chronic looking infarct.  No infarct to explain bilateral visual loss on CT.  Assessment: 63 year old man with hepatitis C, uncontrolled diabetes and hypertension noncompliant to medication, hyperlipidemia presenting for 5 to 7 days worth of headache as well as bilateral visual loss. CT scan was concerning for an infarct, but I do not think that is acute. Given his age and risk factors, stroke should always be considered but also considered should be giant cell arteritis.  Other differentials include severe retinopathy as a complication of diabetes as well as posterior reversible encephalopathy syndrome.  Bilateral visual loss can happen in the  setting of hyperglycemia due to bilateral occipital involvement.  Further imaging would be helpful. I would recommend the following work-up for stroke/TIA as well as GCA.   Impression: Bilateral visual loss-likely in the setting of hyperglycemia Evaluate for stroke Evaluate for giant cell arteritis due to headaches, vision loss Hyperglycemia  Recommendations: Agree with admission to hospitalist Telemetry Aspirin 325 Atorvastatin 80 MRI brain without contrast Allergic to contrast.  MRA head without contrast and carotid Dopplers bilateral. 2D echocardiogram Hemoglobin A1c Fasting lipid panel ESR, CRP Physical therapy Occupational Therapy Speech therapy N.p.o. until cleared by bedside eval or formal speech eval. Management of hyperglycemia per primary team   Stroke team will follow with you  -- Amie Portland, MD [facility] Pager: [phone] If 7pm to 7am, please call on call as listed on AMION.

## 2018-10-26 ENCOUNTER — Observation Stay (HOSPITAL_COMMUNITY): Payer: Medicare Other

## 2018-10-26 ENCOUNTER — Observation Stay (HOSPITAL_BASED_OUTPATIENT_CLINIC_OR_DEPARTMENT_OTHER): Payer: Medicare Other

## 2018-10-26 DIAGNOSIS — I639 Cerebral infarction, unspecified: Secondary | ICD-10-CM | POA: Diagnosis not present

## 2018-10-26 DIAGNOSIS — M21371 Foot drop, right foot: Secondary | ICD-10-CM | POA: Diagnosis present

## 2018-10-26 DIAGNOSIS — I10 Essential (primary) hypertension: Secondary | ICD-10-CM | POA: Diagnosis present

## 2018-10-26 DIAGNOSIS — E1100 Type 2 diabetes mellitus with hyperosmolarity without nonketotic hyperglycemic-hyperosmolar coma (NKHHC): Secondary | ICD-10-CM | POA: Diagnosis present

## 2018-10-26 DIAGNOSIS — B182 Chronic viral hepatitis C: Secondary | ICD-10-CM | POA: Diagnosis present

## 2018-10-26 DIAGNOSIS — H53133 Sudden visual loss, bilateral: Secondary | ICD-10-CM

## 2018-10-26 DIAGNOSIS — R739 Hyperglycemia, unspecified: Secondary | ICD-10-CM | POA: Diagnosis not present

## 2018-10-26 DIAGNOSIS — K760 Fatty (change of) liver, not elsewhere classified: Secondary | ICD-10-CM | POA: Diagnosis present

## 2018-10-26 DIAGNOSIS — Z9114 Patient's other noncompliance with medication regimen: Secondary | ICD-10-CM | POA: Diagnosis not present

## 2018-10-26 DIAGNOSIS — E1136 Type 2 diabetes mellitus with diabetic cataract: Secondary | ICD-10-CM | POA: Diagnosis present

## 2018-10-26 DIAGNOSIS — Z7982 Long term (current) use of aspirin: Secondary | ICD-10-CM | POA: Diagnosis not present

## 2018-10-26 DIAGNOSIS — H543 Unqualified visual loss, both eyes: Secondary | ICD-10-CM | POA: Diagnosis present

## 2018-10-26 DIAGNOSIS — E1165 Type 2 diabetes mellitus with hyperglycemia: Secondary | ICD-10-CM

## 2018-10-26 DIAGNOSIS — Z7984 Long term (current) use of oral hypoglycemic drugs: Secondary | ICD-10-CM | POA: Diagnosis not present

## 2018-10-26 DIAGNOSIS — R945 Abnormal results of liver function studies: Secondary | ICD-10-CM | POA: Diagnosis not present

## 2018-10-26 DIAGNOSIS — Z91041 Radiographic dye allergy status: Secondary | ICD-10-CM | POA: Diagnosis not present

## 2018-10-26 DIAGNOSIS — G894 Chronic pain syndrome: Secondary | ICD-10-CM | POA: Diagnosis present

## 2018-10-26 DIAGNOSIS — R29818 Other symptoms and signs involving the nervous system: Secondary | ICD-10-CM

## 2018-10-26 DIAGNOSIS — E785 Hyperlipidemia, unspecified: Secondary | ICD-10-CM | POA: Diagnosis present

## 2018-10-26 DIAGNOSIS — G43109 Migraine with aura, not intractable, without status migrainosus: Secondary | ICD-10-CM | POA: Diagnosis present

## 2018-10-26 DIAGNOSIS — H538 Other visual disturbances: Secondary | ICD-10-CM | POA: Diagnosis not present

## 2018-10-26 DIAGNOSIS — Z888 Allergy status to other drugs, medicaments and biological substances status: Secondary | ICD-10-CM | POA: Diagnosis not present

## 2018-10-26 DIAGNOSIS — F17210 Nicotine dependence, cigarettes, uncomplicated: Secondary | ICD-10-CM | POA: Diagnosis present

## 2018-10-26 DIAGNOSIS — M21372 Foot drop, left foot: Secondary | ICD-10-CM | POA: Diagnosis present

## 2018-10-26 DIAGNOSIS — E7800 Pure hypercholesterolemia, unspecified: Secondary | ICD-10-CM | POA: Diagnosis present

## 2018-10-26 DIAGNOSIS — H547 Unspecified visual loss: Secondary | ICD-10-CM

## 2018-10-26 DIAGNOSIS — E876 Hypokalemia: Secondary | ICD-10-CM | POA: Diagnosis present

## 2018-10-26 DIAGNOSIS — Z885 Allergy status to narcotic agent status: Secondary | ICD-10-CM | POA: Diagnosis not present

## 2018-10-26 LAB — BASIC METABOLIC PANEL
Anion gap: 10 (ref 5–15)
Anion gap: 12 (ref 5–15)
Anion gap: 7 (ref 5–15)
BUN: 5 mg/dL — ABNORMAL LOW (ref 8–23)
BUN: 7 mg/dL — ABNORMAL LOW (ref 8–23)
BUN: <5 mg/dL — ABNORMAL LOW (ref 8–23)
CO2: 20 mmol/L — ABNORMAL LOW (ref 22–32)
CO2: 26 mmol/L (ref 22–32)
CO2: 33 mmol/L — ABNORMAL HIGH (ref 22–32)
Calcium: 7.9 mg/dL — ABNORMAL LOW (ref 8.9–10.3)
Calcium: 7.9 mg/dL — ABNORMAL LOW (ref 8.9–10.3)
Calcium: 8.1 mg/dL — ABNORMAL LOW (ref 8.9–10.3)
Chloride: 105 mmol/L (ref 98–111)
Chloride: 105 mmol/L (ref 98–111)
Chloride: 105 mmol/L (ref 98–111)
Creatinine, Ser: 0.83 mg/dL (ref 0.61–1.24)
Creatinine, Ser: 0.85 mg/dL (ref 0.61–1.24)
Creatinine, Ser: 1.03 mg/dL (ref 0.61–1.24)
GFR calc Af Amer: >60 mL/min (ref >60)
GFR calc Af Amer: >60 mL/min (ref >60)
GFR calc non Af Amer: >60 mL/min (ref >60)
GFR calc non Af Amer: >60 mL/min (ref >60)
Glucose, Bld: 138 mg/dL — ABNORMAL HIGH (ref 70–99)
Glucose, Bld: 402 mg/dL — ABNORMAL HIGH (ref 70–99)
Glucose, Bld: 664 mg/dL (ref 70–99)
Potassium: 2.8 mmol/L — ABNORMAL LOW (ref 3.5–5.1)
Potassium: 2.9 mmol/L — ABNORMAL LOW (ref 3.5–5.1)
Potassium: 3.8 mmol/L (ref 3.5–5.1)
Sodium: 137 mmol/L (ref 135–145)
Sodium: 141 mmol/L (ref 135–145)
Sodium: 145 mmol/L (ref 135–145)

## 2018-10-26 LAB — HEPATIC FUNCTION PANEL
ALT: 134 U/L — ABNORMAL HIGH (ref 0–44)
AST: 143 U/L — ABNORMAL HIGH (ref 15–41)
Albumin: 3.1 g/dL — ABNORMAL LOW (ref 3.5–5.0)
Alkaline Phosphatase: 125 U/L (ref 38–126)
Bilirubin, Direct: 0.3 mg/dL — ABNORMAL HIGH (ref 0.0–0.2)
Indirect Bilirubin: 0.7 mg/dL (ref 0.3–0.9)
Total Bilirubin: 1 mg/dL (ref 0.3–1.2)
Total Protein: 7.2 g/dL (ref 6.5–8.1)

## 2018-10-26 LAB — CBC
HCT: 43.1 % (ref 39.0–52.0)
Hemoglobin: 14.6 g/dL (ref 13.0–17.0)
MCH: 29.7 pg (ref 26.0–34.0)
MCHC: 33.9 g/dL (ref 30.0–36.0)
MCV: 87.8 fL (ref 80.0–100.0)
Platelets: 130 10*3/uL — ABNORMAL LOW (ref 150–400)
RBC: 4.91 MIL/uL (ref 4.22–5.81)
RDW: 12 % (ref 11.5–15.5)
WBC: 5.7 10*3/uL (ref 4.0–10.5)
nRBC: 0 % (ref 0.0–0.2)

## 2018-10-26 LAB — GLUCOSE, CAPILLARY
Glucose-Capillary: 124 mg/dL — ABNORMAL HIGH (ref 70–99)
Glucose-Capillary: 176 mg/dL — ABNORMAL HIGH (ref 70–99)
Glucose-Capillary: 230 mg/dL — ABNORMAL HIGH (ref 70–99)
Glucose-Capillary: 352 mg/dL — ABNORMAL HIGH (ref 70–99)
Glucose-Capillary: 356 mg/dL — ABNORMAL HIGH (ref 70–99)
Glucose-Capillary: 437 mg/dL — ABNORMAL HIGH (ref 70–99)
Glucose-Capillary: 488 mg/dL — ABNORMAL HIGH (ref 70–99)

## 2018-10-26 LAB — RAPID URINE DRUG SCREEN, HOSP PERFORMED
Amphetamines: NOT DETECTED
BENZODIAZEPINES: NOT DETECTED
Barbiturates: NOT DETECTED
COCAINE: NOT DETECTED
Opiates: NOT DETECTED
TETRAHYDROCANNABINOL: NOT DETECTED

## 2018-10-26 LAB — LIPID PANEL
CHOLESTEROL: 75 mg/dL (ref 0–200)
HDL: 26 mg/dL — ABNORMAL LOW (ref >40)
LDL Cholesterol: 35 mg/dL (ref 0–99)
Total CHOL/HDL Ratio: 2.9 RATIO
Triglycerides: 72 mg/dL (ref <150)
VLDL: 14 mg/dL (ref 0–40)

## 2018-10-26 LAB — SEDIMENTATION RATE: Sed Rate: 35 mm/hr — ABNORMAL HIGH (ref 0–16)

## 2018-10-26 LAB — ACETAMINOPHEN LEVEL: Acetaminophen (Tylenol), Serum: <10 ug/mL — ABNORMAL LOW (ref 10–30)

## 2018-10-26 LAB — HIV ANTIBODY (ROUTINE TESTING W REFLEX): HIV Screen 4th Generation wRfx: NONREACTIVE

## 2018-10-26 LAB — KETONES, URINE: Ketones, ur: NEGATIVE mg/dL

## 2018-10-26 LAB — C-REACTIVE PROTEIN: CRP: 1.2 mg/dL — ABNORMAL HIGH (ref <1.0)

## 2018-10-26 LAB — MAGNESIUM: Magnesium: 1.7 mg/dL (ref 1.7–2.4)

## 2018-10-26 LAB — LIPASE, BLOOD: Lipase: 45 U/L (ref 11–51)

## 2018-10-26 MED ORDER — POTASSIUM CHLORIDE CRYS ER 20 MEQ PO TBCR
60.0000 meq | EXTENDED_RELEASE_TABLET | Freq: Once | ORAL | Status: AC
  Administered 2018-10-26: 60 meq via ORAL
  Filled 2018-10-26: qty 3

## 2018-10-26 MED ORDER — LISINOPRIL 20 MG PO TABS
20.0000 mg | ORAL_TABLET | Freq: Every day | ORAL | Status: DC
  Administered 2018-10-26: 20 mg via ORAL
  Filled 2018-10-26: qty 1

## 2018-10-26 MED ORDER — POTASSIUM CHLORIDE CRYS ER 20 MEQ PO TBCR
40.0000 meq | EXTENDED_RELEASE_TABLET | Freq: Four times a day (QID) | ORAL | Status: AC
  Administered 2018-10-26 (×2): 40 meq via ORAL
  Filled 2018-10-26 (×2): qty 2

## 2018-10-26 MED ORDER — INSULIN GLARGINE 100 UNIT/ML ~~LOC~~ SOLN
10.0000 [IU] | Freq: Every day | SUBCUTANEOUS | Status: DC
  Administered 2018-10-26 – 2018-10-27 (×2): 10 [IU] via SUBCUTANEOUS
  Filled 2018-10-26 (×3): qty 0.1

## 2018-10-26 MED ORDER — GABAPENTIN 300 MG PO CAPS
300.0000 mg | ORAL_CAPSULE | Freq: Every day | ORAL | Status: DC
  Administered 2018-10-26 – 2018-10-28 (×2): 300 mg via ORAL
  Filled 2018-10-26 (×3): qty 1

## 2018-10-26 MED ORDER — ASPIRIN EC 81 MG PO TBEC
81.0000 mg | DELAYED_RELEASE_TABLET | Freq: Every day | ORAL | Status: DC
  Administered 2018-10-27 – 2018-10-28 (×2): 81 mg via ORAL
  Filled 2018-10-26 (×2): qty 1

## 2018-10-26 MED ORDER — AMLODIPINE BESYLATE 10 MG PO TABS
10.0000 mg | ORAL_TABLET | Freq: Every day | ORAL | Status: DC
  Administered 2018-10-26 – 2018-10-28 (×3): 10 mg via ORAL
  Filled 2018-10-26 (×3): qty 1

## 2018-10-26 MED ORDER — INSULIN ASPART 100 UNIT/ML ~~LOC~~ SOLN
10.0000 [IU] | Freq: Once | SUBCUTANEOUS | Status: AC
  Administered 2018-10-26: 10 [IU] via SUBCUTANEOUS

## 2018-10-26 MED ORDER — ENOXAPARIN SODIUM 40 MG/0.4ML ~~LOC~~ SOLN
40.0000 mg | SUBCUTANEOUS | Status: DC
  Administered 2018-10-26 – 2018-10-27 (×2): 40 mg via SUBCUTANEOUS
  Filled 2018-10-26 (×2): qty 0.4

## 2018-10-26 MED ORDER — TRAMADOL HCL 50 MG PO TABS
50.0000 mg | ORAL_TABLET | Freq: Two times a day (BID) | ORAL | Status: DC | PRN
  Administered 2018-10-26 – 2018-10-27 (×2): 50 mg via ORAL
  Filled 2018-10-26 (×2): qty 1

## 2018-10-26 MED FILL — Insulin Regular (Human) Inj 100 Unit/ML: INTRAMUSCULAR | Qty: 0.1 | Status: AC

## 2018-10-26 NOTE — Progress Notes (Signed)
PROGRESS NOTE  Xavier Kennedy  EXB:[id number] DOB: [birth year] DOA: 10/25/2018 PCP: Beckie Salts, MD   Brief Narrative: Xavier Kennedy is a 63 y.o. male with a history of DM, hepatitis C, HTN, HLD, and medication nonadherence who presented to [facility] with 5-7 days of left-sided headache. He had decreased visual fields on right, reported inability to see light in color or shapes properly. Blood sugar was >69m/dl. CT head showed atrophy with chronic small vessel ischemic changes. Formal report included mention of probably subcortical what matter infarct of the right frontal lobe. [facility] consulted, recommended stroke work up, therapy consults, and ESR, CRP to evaluate for GCA. Blood sugar was treated with insulin with improvement. The following day he continues to have peripheral vision loss in the right eye and seeing colorful confetti in his field of vision. Ophthalmology has been consulted.   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 Still with deficits. ?w/left-sided headache consider migraine w/aura vs. glaucoma. GCA considered, though CRP only 1.2 and ESR similarly mildly elevated, not typical of GCA.  - MRI/MRA negative, carotid dopplers without significant stenosis. Echocardiogram pending. Plan to restart low dose aspirin per neurology. - Still persistent severe deficits. Patient lives alone. Not a candidate for inpatient rehabilitation, so plan is to discharge home but currently that is not safe. CM consulted, working on getting family support for assistance at discharge.  - Ophthalmology evaluation pending, discussed  with Dr. MAlanda Slim Poorly-controlled T2DM: Previously only on metformin, reports adherence. Severely hyperglycemic on presentation, improved with sliding scale insulin.  - HbA1c pending, may help guide longer term medications. Would not be very successful if trying to start new insulin today prior to discharge. - Diabetes coordinator following. Pt will benefit from education and supplies at discharge.  - Continue SSI  Hypokalemia: Severe, refractory to replacement, down as expected with improvement in hyperglycemia. Pt not having unusual losses. - Replace by mouth this morning, recheck in PM and supplement as indicated. Check Mg as well.  HTN:  - Continue lisinopril, amlodipine  Chronic hepatitis C with elevated LFTs. U/S only shows hepatic steatosis, possibly from diabetes.  - Outpatient follow up recommended  DVT prophylaxis: Lovenox Code Status: Full Family Communication: By phone Disposition Plan: Pt not safe for discharge home alone with severe vision deficits. Requires ophthalmology evaluation and close monitoring of severe hypokalemia. Also with severe hyperglycemia and insulin-naive, will need education and initiation of new therapy prior to discharge. Will convert to inpatient for these reasons.  Consultants:   Neurology  Ophthalmology  Procedures:   None  Antimicrobials:  None   Subjective: Vision is no better, trouble seeing to the right for several days. Also seeing abnormal shapes, impaired light perception bilaterally. Left-sided pounding headache is stable, constant. No numbness, weakness.  Objective: Vitals:   10/26/18 0509 10/26/18 0827 10/26/18 1237 10/26/18 1641  BP: (!) 151/85 (!) 148/87 (!) 156/86 (!) 142/81  Pulse: 85 91 91 (!) 101  Resp: 18 19    Temp: 98.3 F (36.8 C) 98.3 F (36.8 C) 98.2 F (36.8 C) 98.3 F (36.8 C)  TempSrc: Oral Oral Oral Oral  SpO2:  97% 99% 100% 100%  Weight:      Height:  6' (1.829 m)      Intake/Output Summary (Last  24 hours) at 10/26/2018 1644 Last data filed at 10/26/2018 1600 Gross per 24 hour  Intake 2364.76 ml  Output 2400 ml  Net -35.24 ml   Filed Weights   10/25/18 1530 10/25/18 2156  Weight: 96.6 kg 90.7 kg    Gen: 63 y.o. male in no distress  Pulm: Non-labored breathing room air. Clear to auscultation bilaterally.  CV: Regular rate and rhythm. No murmur, rub, or gallop. No JVD, no pedal edema. GI: Abdomen soft, non-tender, non-distended, with normoactive bowel sounds. No organomegaly or masses felt. Ext: Warm, no deformities Skin: No rashes, lesions or ulcers Neuro: Alert and oriented. PERRL, impaired visual acuity bilaterally R > L with decreased right eye lateral visual field to confrontation. No other focal neurological deficits. Psych: Judgement and insight appear normal. Mood & affect appropriate.   Data Reviewed: I have personally reviewed following labs and imaging studies  CBC: Recent Labs  Lab 10/25/18 1625 10/26/18 0007  WBC 6.3 5.7  NEUTROABS 4.4  --   HGB 16.6 14.6  HCT 49.7 43.1  MCV 89.9 87.8  PLT 150 071*   Basic Metabolic Panel: Recent Labs  Lab 10/25/18 1625 10/26/18 0007 10/26/18 0552  NA 134* 141 145  K 3.2* 2.9* 2.8*  CL 96* 105 105  CO2 20* 26 33*  GLUCOSE 629* 402* 138*  BUN 9 5* <5*  CREATININE 0.95 0.85 0.83  CALCIUM 8.5* 7.9* 8.1*   GFR: Estimated Creatinine Clearance: 100 mL/min (by C-G formula based on SCr of 0.83 mg/dL). Liver Function Tests: Recent Labs  Lab 10/25/18 1625 10/26/18 0552  AST 147* 143*  ALT 151* 134*  ALKPHOS 157* 125  BILITOT 1.2 1.0  PROT 7.8 7.2  ALBUMIN 3.6 3.1*   Recent Labs  Lab 10/26/18 0552  LIPASE 45   No results for input(s): AMMONIA in the last 168 hours. Coagulation Profile: Recent Labs  Lab 10/25/18 1625  INR 0.94   Cardiac Enzymes: Recent Labs  Lab 10/25/18 1625  TROPONINI <0.03   BNP (last 3 results) No results for input(s): PROBNP in the last 8760 hours. HbA1C: No results for  input(s): HGBA1C in the last 72 hours. CBG: Recent Labs  Lab 10/25/18 1928 10/25/18 2117 10/25/18 2212 10/26/18 0033 10/26/18 0506  GLUCAP 368* 356* 344* 352* 124*   Lipid Profile: Recent Labs    10/26/18 0007  CHOL 75  HDL 26*  LDLCALC 35  TRIG 72  CHOLHDL 2.9   Thyroid Function Tests: No results for input(s): TSH, T4TOTAL, FREET4, T3FREE, THYROIDAB in the last 72 hours. Anemia Panel: No results for input(s): VITAMINB12, FOLATE, FERRITIN, TIBC, IRON, RETICCTPCT in the last 72 hours. Urine analysis: No results found for: COLORURINE, APPEARANCEUR, LABSPEC, PHURINE, GLUCOSEU, HGBUR, BILIRUBINUR, KETONESUR, PROTEINUR, UROBILINOGEN, NITRITE, LEUKOCYTESUR No results found for this or any previous visit (from the past 240 hour(s)).    Radiology Studies: Ct Head Wo Contrast  Result Date: 10/25/2018 CLINICAL DATA:  Severe headache, lost vision 1 week ago, cough, nausea, vomiting, history diabetes mellitus, hypertension EXAM: CT HEAD WITHOUT CONTRAST TECHNIQUE: Contiguous axial images were obtained from the base of the skull through the vertex without intravenous contrast. Sagittal and coronal MPR images reconstructed from axial data set. COMPARISON:  None. FINDINGS: Brain: Generalized atrophy. Normal ventricular morphology. No midline shift or mass effect. Small vessel chronic ischemic changes of deep cerebral white matter.  Question subcortical white matter infarct and RIGHT frontal lobe versus pronounced white matter disease. No intracranial hemorrhage, mass lesion or additional evidence of infarction. No extra-axial fluid collections. Vascular: Atherosclerotic calcifications of internal carotid arteries bilaterally at skull base Skull: Calvaria intact. Incomplete posterior arch C1, developmental variant. Sinuses/Orbits: Mucosal thickening LEFT maxillary sinus. Other: N/A IMPRESSION: Atrophy with small vessel chronic ischemic changes of deep cerebral white matter. 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 No additional intracranial abnormalities. Electronically Signed   By: Lavonia Dana M.D.   On: 10/25/2018 16:22   Mr Jodene Nam Head Wo Contrast  Result Date: 10/26/2018 CLINICAL DATA:  Stroke follow-up. Headache and visual loss for 5-7 days. EXAM: MRI HEAD WITHOUT CONTRAST MRA HEAD WITHOUT CONTRAST TECHNIQUE: Multiplanar, multiecho pulse sequences of the brain and surrounding structures were obtained without intravenous contrast. Angiographic images of the head were obtained using MRA technique without contrast. COMPARISON:  Head CT 10/25/2018 FINDINGS: MRI HEAD FINDINGS BRAIN: There is no acute infarct, acute hemorrhage or mass effect. The midline structures are normal. Multifocal white matter hyperintensity, most commonly due to chronic ischemic microangiopathy. Generalized atrophy without lobar predilection. Susceptibility-sensitive sequences show no chronic microhemorrhage or superficial siderosis. SKULL AND UPPER CERVICAL SPINE: The visualized skull base, calvarium, upper cervical spine and extracranial soft tissues are normal. SINUSES/ORBITS: No fluid levels or advanced mucosal thickening. No mastoid or middle ear effusion. The orbits are normal. MRA HEAD FINDINGS POSTERIOR CIRCULATION: --Basilar artery: Normal. --Posterior cerebral arteries: Normal. Right PCA is partially supplied by the right P-comm. --Superior cerebellar arteries: Normal. --Inferior cerebellar arteries: Normal anterior and posterior inferior cerebellar arteries. ANTERIOR CIRCULATION: --Intracranial internal carotid arteries: Normal. --Anterior cerebral arteries: Normal. Both A1 segments are present. Patent anterior communicating artery. --Middle cerebral arteries: Normal. --Posterior communicating arteries: Present on the right, absent on the left. --Vertebral arteries: Left dominant.  Patent. IMPRESSION: 1. No acute intracranial abnormality. 2. 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 3. Normal intracranial MRA. Electronically Signed   By: Ulyses Jarred M.D.   On: 10/26/2018 04:29   Mr Brain Wo Contrast  Result Date: 10/26/2018 CLINICAL DATA:  Stroke follow-up. Headache and visual loss for 5-7 days. EXAM: MRI HEAD WITHOUT CONTRAST MRA HEAD WITHOUT CONTRAST TECHNIQUE: Multiplanar, multiecho pulse sequences of the brain and surrounding structures were obtained without intravenous contrast. Angiographic images of the head were obtained using MRA technique without contrast. COMPARISON:  Head CT 10/25/2018 FINDINGS: MRI HEAD FINDINGS BRAIN: There is no acute infarct, acute hemorrhage or mass effect. The midline structures are normal. Multifocal white matter hyperintensity, most commonly due to chronic ischemic microangiopathy. Generalized atrophy without lobar predilection. Susceptibility-sensitive sequences show no chronic microhemorrhage or superficial siderosis. SKULL AND UPPER CERVICAL SPINE: The visualized skull base, calvarium, upper cervical spine and extracranial soft tissues are normal. SINUSES/ORBITS: No fluid levels or advanced mucosal thickening. No mastoid or middle ear effusion. The orbits are normal. MRA HEAD FINDINGS POSTERIOR CIRCULATION: --Basilar artery: Normal. --Posterior cerebral arteries: Normal. Right PCA is partially supplied by the right P-comm. --Superior cerebellar arteries: Normal. --Inferior cerebellar arteries: Normal anterior and posterior inferior cerebellar arteries. ANTERIOR CIRCULATION: --Intracranial internal carotid arteries: Normal. --Anterior cerebral arteries: Normal. Both A1 segments are present. Patent anterior communicating artery. --Middle cerebral arteries: Normal. --Posterior communicating arteries: Present on the right, absent on the left. --Vertebral arteries: Left dominant.  Patent. IMPRESSION: 1. No acute intracranial abnormality. 2. Multifocal white matter disease most consistent with chronic ischemic microangiopathy. 3.  Normal intracranial MRA. Electronically  Signed   By: Ulyses Jarred M.D.   On: 10/26/2018 04:29   US Abdomen Complete  Result Date: 10/26/2018 CLINICAL DATA:  63 year old male with elevated LFTs. Patient with hepatitis-C and diabetes. EXAM: ABDOMEN ULTRASOUND COMPLETE COMPARISON:  01/18/2015 ultrasound FINDINGS: Gallbladder: Mild gallbladder wall thickening is unchanged. There is no evidence of cholelithiasis, sonographic Murphy sign or pericholecystic fluid. Common bile duct: Diameter: 3 mm. No intrahepatic or extrahepatic biliary dilatation. Liver: Slightly heterogeneous and increased echogenicity of the hepatic echotexture noted. No focal hepatic abnormalities are identified. Portal vein is patent on color Doppler imaging with normal direction of blood flow towards the liver. IVC: No abnormality visualized. Pancreas: Visualized portion unremarkable. Spleen: Size and appearance within normal limits. Right Kidney: Length: 10.7 cm. Echogenicity within normal limits. No mass or hydronephrosis visualized. Left Kidney: Length: 10.1 cm. Echogenicity within normal limits. No mass or hydronephrosis visualized. Abdominal aorta: No aneurysm visualized. Other findings: None. IMPRESSION: 1. Slightly heterogeneous and echogenic hepatic echotexture which may represent hepatic steatosis. 2. Mild gallbladder wall thickening again noted without cholelithiasis or other signs of acute cholecystitis, probably related to hepatic dysfunction. 3. No evidence of biliary dilatation. Electronically Signed   By: Margarette Canada M.D.   On: 10/26/2018 11:41   Vas US Carotid  Result Date: 10/26/2018 Carotid Arterial Duplex Study Indications: CVA. Performing Technologist: June Leap RDMS, RVT  Examination Guidelines: A complete evaluation includes B-mode imaging, spectral Doppler, color Doppler, and power Doppler as needed of all accessible portions of each vessel. Bilateral testing is considered an integral part of a complete examination.  Limited examinations for reoccurring indications may be performed as noted.  Right Carotid Findings: +----------+--------+-------+--------+---------------------+-------------------+           PSV cm/sEDV    StenosisDescribe             Comments                              cm/s                                                    +----------+--------+-------+--------+---------------------+-------------------+ CCA Prox  85      7                                                       +----------+--------+-------+--------+---------------------+-------------------+ CCA Distal88      16                                                      +----------+--------+-------+--------+---------------------+-------------------+ ICA Prox  66      12     1-39%   smooth and           atypical appearance                                  homogeneous                              +----------+--------+-------+--------+---------------------+-------------------+  ICA Distal71      18                                                      +----------+--------+-------+--------+---------------------+-------------------+ ECA       130     19                                                      +----------+--------+-------+--------+---------------------+-------------------+ +----------+--------+-------+----------------+-------------------+           PSV cm/sEDV cmsDescribe        Arm Pressure (mmHG) +----------+--------+-------+----------------+-------------------+ [id number]             Multiphasic, WNL                    +----------+--------+-------+----------------+-------------------+ +---------+--------+--------+------+ VertebralPSV cm/sEDV cm/sAbsent +---------+--------+--------+------+  Left Carotid Findings: +----------+--------+--------+--------+------------+--------+           PSV cm/sEDV cm/sStenosisDescribe    Comments  +----------+--------+--------+--------+------------+--------+ CCA Prox  99      15                                   +----------+--------+--------+--------+------------+--------+ CCA Distal65      18                                   +----------+--------+--------+--------+------------+--------+ ICA Prox  24      7       1-39%   heterogenous         +----------+--------+--------+--------+------------+--------+ ICA Distal57      15                                   +----------+--------+--------+--------+------------+--------+ ECA       147     21                                   +----------+--------+--------+--------+------------+--------+ +----------+--------+--------+----------------+-------------------+ SubclavianPSV cm/sEDV cm/sDescribe        Arm Pressure (mmHG) +----------+--------+--------+----------------+-------------------+           188             Multiphasic, WNL                    +----------+--------+--------+----------------+-------------------+ +---------+--------+--+--------+--+---------+ VertebralPSV cm/s48EDV cm/s12Antegrade +---------+--------+--+--------+--+---------+  Summary: Right Carotid: Velocities in the right ICA are consistent with a 1-39% stenosis. Left Carotid: Velocities in the left ICA are consistent with a 1-39% stenosis. Vertebrals:  Left vertebral artery demonstrates antegrade flow. Right vertebral              artery was not visualized. Subclavians: Normal flow hemodynamics were seen in bilateral subclavian              arteries. *See table(s) above for measurements and observations.  Electronically signed by Antony Contras MD on 10/26/2018 at 4:09:18 PM.    Final     Scheduled Meds: .  stroke: mapping our early stages of recovery book   Does not apply Once  . amLODipine  10 mg Oral Daily  . [START ON 10/27/2018] aspirin EC  81 mg Oral Daily  . gabapentin  300 mg Oral Daily  . insulin aspart  0-9 Units Subcutaneous Q4H  .  insulin glargine  10 Units Subcutaneous Daily  . lisinopril  20 mg Oral Daily   Continuous Infusions: . sodium chloride 150 mL/hr at 10/26/18 1242     LOS: 0 days   Time spent: 25 minutes.  Patrecia Pour, MD [facility] [email or web address] Password [facility] 10/26/2018, 4:44 PM

## 2018-10-26 NOTE — Evaluation (Signed)
Physical Therapy Evaluation Patient Details Name: Xavier Kennedy M Eichinger MRN: [id number] DOB: 03/10/1956 Today's Date: 10/26/2018   History of Present Illness  63 y.o. male with history of hypertension, diabetes mellitus, s/p back sx, s/p hip replacements, and hepatitis C presents to the ER with complaints of visual loss mostly on the right side visual field for the last 1 week.  Also has benign left temporal headache at the same time. MRI showing no acute abnormalities. Patient reports several falls leading up to his admission into the hospital.    Clinical Impression  Pt admitted with above diagnosis. Pt currently with functional limitations due to the deficits listed below (see PT Problem List). Patient received in chair. Patient agreeable to PT evaluation and participation in therapy today. Patient reports only seeing boxes and light on his right side. Patient able to stand with min guard but once he intended to move, he reached for objects without an assistive device. Patient then ambulated in hallway with RW with min guard with trunk flexed. When ambulating, patient exhibited alarm when another hospital worker passed him on his right. He was unaware of her presence until she was well in front of him and in a more central visual field. PT walked on patient's right during ambulation. Patient reported he was unable to see PT at all.  Pt will benefit from skilled PT to increase their independence and safety with mobility to allow discharge to the venue listed below.       Follow Up Recommendations CIR;Supervision for mobility/OOB    Equipment Recommendations  None recommended by PT    Recommendations for Other Services Rehab consult     Precautions / Restrictions Precautions Precautions: Fall;Other (comment) Precaution Comments: Right side vision loss      Mobility  Bed Mobility Overal bed mobility: Independent             General bed mobility comments: patient received in  chair  Transfers Overall transfer level: Needs assistance Equipment used: Rolling walker (2 wheeled);None Transfers: Sit to/from [employer]Stand;Stand Pivot Transfers Sit to Stand: Min guard Stand pivot transfers: Min guard;Min assist       General transfer comment: increased time; min guard for safety; min assist for line management and cue patient to upcoming objects on right; Patient reaching for objects upon standing  Ambulation/Gait Ambulation/Gait assistance: Min guard Gait Distance (Feet): 225 Feet Assistive device: Rolling walker (2 wheeled);None Gait Pattern/deviations: Step-through pattern;Decreased stride length;Decreased step length - right;Decreased step length - left;Trunk flexed Gait velocity: decreased   General Gait Details: Patient reaching for objects upon standing without assistive device requiring min assist; with RW, patient ambulated with trunk flexed and hard heel strike bilateral, no c/o numbness/tingling; Patient required cues for upcoming objects on right due to visual deficits.  Stairs            Wheelchair Mobility    Modified Rankin (Stroke Patients Only) Modified Rankin (Stroke Patients Only) Pre-Morbid Rankin Score: No significant disability Modified Rankin: Moderately severe disability     Balance Overall balance assessment: Needs assistance Sitting-balance support: No upper extremity supported;Feet supported Sitting balance-Leahy Scale: Fair     Standing balance support: During functional activity;No upper extremity supported Standing balance-Leahy Scale: Fair Standing balance comment: able to maintain standing without UE support                             Pertinent Vitals/Pain Pain Assessment: 0-10 Pain Score: 7  Faces  Pain Scale: Hurts a little bit Pain Location: Back Pain Descriptors / Indicators: Constant;Aching;Throbbing Pain Intervention(s): Limited activity within patient's tolerance;Monitored during session    Home  Living Family/patient expects to be discharged to:: Private residence Living Arrangements: Alone Available Help at Discharge: Family;Available PRN/intermittently Type of Home: House Home Access: Stairs to enter Entrance Stairs-Rails: Can reach both Entrance Stairs-Number of Steps: 4 Home Layout: One level Home Equipment: Walker - 2 wheels;Cane - single point;Bedside commode;Tub bench;Shower seat      Prior Function Level of Independence: Independent         Comments: ADLs, IADLs, driving     Hand Dominance   Dominant Hand: Right    Extremity/Trunk Assessment   Upper Extremity Assessment Upper Extremity Assessment: Defer to OT evaluation    Lower Extremity Assessment Lower Extremity Assessment: Overall WFL for tasks assessed    Cervical / Trunk Assessment Cervical / Trunk Assessment: Kyphotic;Other exceptions Cervical / Trunk Exceptions: Prior back sxs and presents with forward flexion and rounded shoulders  Communication   Communication: No difficulties  Cognition Arousal/Alertness: Awake/alert Behavior During Therapy: WFL for tasks assessed/performed Overall Cognitive Status: Within Functional Limits for tasks assessed                                 General Comments: Feel pt is at baseline cognition for BADLs. Pt able to follow cues and demonstrated [facility] memory.       General Comments General comments (skin integrity, edema, etc.): Pt verbalizing frustration about NPO and doesn't know why he is NPO. Notified RN.     Exercises     Assessment/Plan    PT Assessment Patient needs continued PT services  PT Problem List Decreased mobility;Decreased safety awareness;Decreased activity tolerance;Decreased balance;Decreased knowledge of use of DME;Decreased coordination;Decreased knowledge of precautions;Pain       PT Treatment Interventions DME instruction;Therapeutic activities;Gait training;Therapeutic exercise;Patient/family education;Balance  training    PT Goals (Current goals can be found in the Care Plan section)  Acute Rehab PT Goals Patient Stated Goal: "Go home" PT Goal Formulation: With patient Time For Goal Achievement: 11/09/18 Potential to Achieve Goals: Good    Frequency Min 3X/week   Barriers to discharge Decreased caregiver support Patient currently unable to drive. Patient would be unsafe to ride public transportation due to right visual field deficits    Co-evaluation               AM-PAC PT "6 Clicks" Mobility  Outcome Measure Help needed turning from your back to your side while in a flat bed without using bedrails?: A Little Help needed moving from lying on your back to sitting on the side of a flat bed without using bedrails?: A Little Help needed moving to and from a bed to a chair (including a wheelchair)?: A Little Help needed standing up from a chair using your arms (e.g., wheelchair or bedside chair)?: A Little Help needed to walk in hospital room?: A Little Help needed climbing 3-5 steps with a railing? : A Little 6 Click Score: 18    End of Session Equipment Utilized During Treatment: Gait belt Activity Tolerance: Patient tolerated treatment well Patient left: in chair;with call bell/phone within reach Nurse Communication: Mobility status PT Visit Diagnosis: Other abnormalities of gait and mobility (R26.89);Other symptoms and signs involving the nervous system (R29.898);Unsteadiness on feet (R26.81);History of falling (Z91.81)    Time: 0920-0950 PT Time Calculation (min) (ACUTE ONLY):  30 min   Charges:   PT Evaluation $PT Eval Low Complexity: 1 Low PT Treatments $Gait Training: 8-22 mins        Katina Dung. Hartnett-Rands, MS, PT Per Diem PT [facility] [phone] 10/26/2018, 10:03 AM

## 2018-10-26 NOTE — Progress Notes (Signed)
Lab called for blood sugar resulted at [phone]. CBG at [phone]. Xavier Kennedy notified and ordered to give 10 units of Novolog. Will continue to monitor pt.

## 2018-10-26 NOTE — Consult Note (Signed)
Chief Complaint  Patient presents with  . Loss of Vision  :       Ophthalmology HPI: This is a 63 y.o.  male with a past ocular history listed below that presents with subjective visual changes in both eyes.  He states two weeks ago he lost vison off to his right side in both eyes.  He also see's "Boxes" stacking on top of each other on the right side.  He has no flashes of light or floaters.  Denies a curtain coming over his vision. He states the vision problems are only in the right eye, then later states both eyes are causing problems.   He states prior to coming to the hospital, his vision "went black" for six hours then returned. He still has persistent right sided visual field defect.  "I cant see over there. I dont see you on this side."      Past Ocular History:  Refractive error,  Pseudophakia OS Cataract OD.     Last Eye Exam: 1 year ago, [facility]    Primary Eye Care:  [facility]  Past Medical History:  Diagnosis Date  . Back pain   . Diabetes mellitus without complication (HCC)   . Hepatitis C   . High cholesterol   . Hypertension      Past Surgical History:  Procedure Laterality Date  . BACK SURGERY    . HIP SURGERY    . SHOULDER SURGERY       Social History   Socioeconomic History  . Marital status: Divorced    Spouse name: Not on file  . Number of children: Not on file  . Years of education: Not on file  . Highest education level: Not on file  Occupational History  . Not on file  Social Needs  . Financial resource strain: Not on file  . Food insecurity:    Worry: Not on file    Inability: Not on file  . Transportation needs:    Medical: Not on file    Non-medical: Not on file  Tobacco Use  . Smoking status: Current Every Day Smoker    Packs/day: 0.50    Types: Cigarettes  . Smokeless tobacco: Never Used  Substance and Sexual Activity  . Alcohol use: Never    Frequency: Never  . Drug use: Never  . Sexual  activity: Not on file  Lifestyle  . Physical activity:    Days per week: Not on file    Minutes per session: Not on file  . Stress: Not on file  Relationships  . Social connections:    Talks on phone: Not on file    Gets together: Not on file    Attends religious service: Not on file    Active member of club or organization: Not on file    Attends meetings of clubs or organizations: Not on file    Relationship status: Not on file  . Intimate partner violence:    Fear of current or ex partner: Not on file    Emotionally abused: Not on file    Physically abused: Not on file    Forced sexual activity: Not on file  Other Topics Concern  . Not on file  Social History Narrative  . Not on file     Allergies  Allergen Reactions  . Gabapentin Nausea Only    Other reaction(s): Other (See Comments) Nausea   . Ivp Dye [Iodinated Diagnostic Agents]   . Morphine Nausea  Only  . Oxycodone Nausea Only    Weight loss  . Trazodone And Nefazodone Other (See Comments)    Pt reports vision changes     No current facility-administered medications on file prior to encounter.    Current Outpatient Medications on File Prior to Encounter  Medication Sig Dispense Refill  . amLODipine (NORVASC) 10 MG tablet Take 10 mg by mouth daily.    Marland Kitchen. aspirin 81 MG tablet Take 81 mg by mouth daily.    . DULoxetine (CYMBALTA) 30 MG capsule Take 30 mg by mouth at bedtime as needed (mood).     Marland Kitchen. lisinopril (PRINIVIL,ZESTRIL) 20 MG tablet Take 20 mg by mouth daily.    . metFORMIN (GLUCOPHAGE) 500 MG tablet Take 500 mg by mouth 2 (two) times daily as needed (high blood sugar/not feeling well).     . NUCYNTA 75 MG tablet Take 75 mg by mouth every 12 (twelve) hours as needed for moderate pain.   0  . potassium chloride (K-DUR) 10 MEQ tablet Take 10 mEq by mouth daily as needed (low potassium).        ROS    Exam:  General: Awake, Alert, Oriented *3  Vision (near): without correction    OD: 20/200  equivalent  OS: 20/200 equivalent  Confrontational Field:   Subjectively right homonymous hemianopsia  Extraocular Motility:  Full ductions and versions, both eyes  .   External:   Normal Symmetry, V1-3 intact, infraorbital nerve appears intact.   Pupils  OD: 4mm to 3mm reactive without afferent pupillary defect (APD)  OS: 4mm to 3mm reactive without afferent pupillary defect (APD)              Minimally reactive OU  IOP(tonopen)  OD: 14  OS: 18  Slit Lamp Exam:  Lids/Lashes  OD: Normal Lids and lashes, No lesion or injury  OS: Normal lids and lashes, nor lesion or injury  Conjucntiva/Sclera  OD: White and quiet  OS: White and quiet  Cornea  OD: Clear without abrasion or defect  OS: Clear without abrasion or defect  Anterior Chamber  OD: Deep and quiet  OS: Deep and quiet  Iris  OD: Normal iris architecture  OS: Normal Iris Architecture   Lens  OD:  1+NSC   OS: pseudophakia  Anterior Vitreous  OD: Clear, without cell  OS: Clear without cell   POSTERIOR POLE EXAM (Dialated with phenylephrine and tropicamide.Dilation may last up to 24 hours)  View:   OD: 20/20 view without opacities  OS: 20/20 view without opacities  Vitreous:   OD: Clear, no cell  OS: Clear, no cell  Disc:   OD: flat, sharp margin, with appropriate color, PPA   OS: flat, sharp margin, with appropriate color, PPA   C:D Ratio:   OD: 0.4  OS: 0.4  Macula  OD: Flat, with appropriate light reflex  OS: Flat with appropriate light reflex  Vessels  OD: Normal vasculature  OS: Normal vasculature  Periphery  OD: rare chorioretinal scar  ZO:XWRUOS:Rare chroioretinal scar     Assessment and Plan:   This is 63 y.o.  male with subjective visual complaints.   Homonymous Hemianopsia -  There is no clear reason for this. MRI has ruled out a stroke or intracranial pathology. There is no intraocular pathology to explain the deficit. I cannot rule out malingering.  - Recommend  outpatient work up with automated visual fields  Transient vision loss.  - Transient vision loss occurring for several hours. No  acute ocular pathology to explain this. Recommend work up similar to central retinal artery occlusion - Recommend: Echo Cardiogram, Carotid artery duplex, Consider halter monitor.   Cataract Right eye - Patient does have a cataract in the right eye. No acute intervention required. Does not explain visual deficits.   Pseudophakia OS - Stable. Continue to follow  Diabetes without Diabetic Retinopathy - No intervention recommended. Continue Blood glucose control, blood pressure control, and smoking cessation.   Follow up as outpatient for further evaluation and management.   Mack Hook, M.D.  [facility] [address] [place], [place] [address] [phone] ([phone]

## 2018-10-26 NOTE — Progress Notes (Addendum)
Inpatient Diabetes Program Recommendations  AACE/ADA: New Consensus Statement on Inpatient Glycemic Control (2015)  Target Ranges:  Prepandial:   less than 140 mg/dL      Peak postprandial:   less than 180 mg/dL (1-2 hours)      Critically ill patients:  140 - 180 mg/dL   Lab Results  Component Value Date   GLUCAP 124 (H) 10/26/2018    Review of Glycemic Control Results for JAMAURIE, PFENNING (MRN [id number]) as of 10/26/2018 11:24  Ref. Range 10/25/2018 19:28 10/25/2018 21:17 10/25/2018 22:12 10/26/2018 00:33 10/26/2018 05:06  Glucose-Capillary Latest Ref Range: 70 - 99 mg/dL 250 (H) 037 (H) 048 (H) 352 (H) 124 (H)   Diabetes history: DM 2 Outpatient Diabetes medications: Metformin 500 mg bid Current orders for Inpatient glycemic control:  Lantus 10 units daily, Novolog sensitive q 4 hours  Inpatient Diabetes Program Recommendations:    Per review of chart, patient's A1C was 7.1% on 07/29/18. Awaiting current A1C results.  Blood sugars improved this morning.  Will see patient today.   Thanks,  Beryl Meager, RN, BC-ADM Inpatient Diabetes Coordinator Pager [phone] (8a-5p)  Addendum 1445:  Spoke with patient regarding recent blood sugar management.  He states he has not been on steroids or other medications that may raise blood sugars.  We discussed last A1C of 7.1%.  Patient states he was taking Metformin as ordered. Explained that A1C was pending.  He does not have a glucose meter at home.  Will f/u with patient on 2/13 regarding A1C results and potential need for insulin at d/c.  He had cover over his head stating his vision was still bothering him as well.

## 2018-10-26 NOTE — Care Management Note (Addendum)
Case Management Note  Patient Details  Name: Xavier Kennedy MRN: [id number] Date of Birth: 02/20/1956  Subjective/Objective:    Pt in to r/o CVA. He is from home alone. His sister, niece and daughter live close by.  DME: cane, walker, 3 in 1, shower seat Pt denies any medication issues. Pt has been driving prior to hospitalization. Family able to assist after d/c.               Action/Plan: PT/OT recommending CIR. CM following for d/c disposition.  Addendum (1600): CM spoke to pts sister: Xavier Kennedy and she feels that patients nephew: [name] will stay with him at home for at least several days. CM reiterated that patient is not to drive. [facility] CM provided him choice. CM following.  Expected Discharge Date:                  Expected Discharge Plan:  IP Rehab Facility  In-House Referral:     Discharge planning Services  CM Consult  Post Acute Care Choice:    Choice offered to:     DME Arranged:    DME Agency:     HH Arranged:    HH Agency:     Status of Service:  In process, will continue to follow  If discussed at Long Length of Stay Meetings, dates discussed:    Additional Comments:  Kermit BaloKelli F Abbrielle Batts, RN 10/26/2018, 2:10 PM

## 2018-10-26 NOTE — Progress Notes (Signed)
Rehab Admissions Coordinator Note:  Patient was screened by Trish Mage for appropriateness for an Inpatient Acute Rehab Consult.  At this time, we are recommending OP  Or HH therapies.  Patient is already doing too well to meet criteria for acute inpatient rehab admission.  In addition, he lacks the medical necessity for an inpatient rehab stay.  If his status should change or he has a further functional decline, please give me a call.  Trish Mage 10/26/2018, 11:02 AM  I can be reached at [phone].

## 2018-10-26 NOTE — Care Management Obs Status (Signed)
MEDICARE OBSERVATION STATUS NOTIFICATION   Patient Details  Name: Xavier Kennedy MRN: [id number] Date of Birth: July 30, 1956   Medicare Observation Status Notification Given:  Yes    Lawerance Sabal, RN 10/26/2018, 3:59 PM

## 2018-10-26 NOTE — Evaluation (Signed)
Occupational Therapy Evaluation Patient Details Name: Xavier Kennedy MRN: [id number] DOB: 1955-11-19 Today's Date: 10/26/2018    History of Present Illness 63 y.o. male with history of hypertension, diabetes mellitus, s/p back sx, s/p hip replacements, and hepatitis C presents to the ER with complaints of visual loss mostly on the right side visual field for the last 1 week.  Also has benign left temporal headache at the same time. MRI showing no acute abnormalities.    Clinical Impression   PTA, pt was living alone and was independent with ADLs, IADLs, driving and used a cane and/or RW for functional mobility. Pt currently requiring Min Guard A for safety and balance during ADLs and functional mobility. Pt presenting with right visual field deficits which impact his safe and performance of ADLs and mobility. Pt requiring cues for safe navigation of space due to right visual deficits. Pt will require further acute OT to facilitate safe dc. Recommend dc to CIR for intensive rehab to optimize safety and reaching Mod I level for ADLs and IADLs before returning home.     Follow Up Recommendations  CIR;Supervision/Assistance - 24 hour    Equipment Recommendations  None recommended by OT    Recommendations for Other Services       Precautions / Restrictions Precautions Precautions: Fall;Other (comment) Precaution Comments: Right side vision loss      Mobility Bed Mobility Overal bed mobility: Independent                Transfers Overall transfer level: Needs assistance Equipment used: Rolling walker (2 wheeled);None Transfers: Sit to/from Stand Sit to Stand: Min assist         General transfer comment: Min A for power up into standing from EOB.     Balance Overall balance assessment: Needs assistance Sitting-balance support: No upper extremity supported;Feet supported Sitting balance-Leahy Scale: Fair     Standing balance support: No upper extremity supported;During  functional activity Standing balance-Leahy Scale: Fair Standing balance comment: able to maintain standing without UE support                           ADL either performed or assessed with clinical judgement   ADL Overall ADL's : Needs assistance/impaired Eating/Feeding: NPO   Grooming: Oral care;Wash/dry face;Standing;Supervision/safety Grooming Details (indicate cue type and reason): Pt performing grooming at sink with Supervision for safety. Able to locate all items in right visual field during grooming. demonstrating WFL coorindation and depth perception Upper Body Bathing: Supervision/ safety;Sitting   Lower Body Bathing: Min guard;Sit to/from stand   Upper Body Dressing : Supervision/safety;Sitting Upper Body Dressing Details (indicate cue type and reason): Donning second gown like a jacket Lower Body Dressing: Min guard;Sit to/from stand Lower Body Dressing Details (indicate cue type and reason): Pt performing peri care while at sink with Min Guard A for safety and balance Toilet Transfer: Min guard;Ambulation;RW(simulated to recliner)           Functional mobility during ADLs: Min guard;Rolling walker General ADL Comments: Pt presenting near baseline functional. Main deficit being right sides visual deficits. Pt performing ADLs and functional mobility with suprvision-Min Guard A for safety. Educating pt on no driving and will require follow up with eye MD     Vision Baseline Vision/History: Wears glasses Wears Glasses: Reading only Patient Visual Report: No change from baseline Vision Assessment?: Yes Eye Alignment: Within Functional Limits Ocular Range of Motion: Within Functional Limits Alignment/Gaze Preference: Within Defined  Limits Tracking/Visual Pursuits: Decreased smoothness of horizontal tracking;Decreased smoothness of vertical tracking;Impaired - to be further tested in functional context(Decreased smooth tracking to all quads.) Saccades: Within  functional limits Convergence: Within functional limits Visual Fields: Right visual field deficit Additional Comments: Pt reporting blurry vision when left eye is closed; able to recognize shapes. Pt reports that in right visual field (when looking straight ahead) he states there is bright colors and square boxes. Reports that left visual field is clear. Decreased visual attention and becomes tired during tracking. Noting deecreased smooth tracking to all quads     Perception     Praxis      Pertinent Vitals/Pain Pain Assessment: Faces Faces Pain Scale: Hurts a little bit Pain Location: Back Pain Descriptors / Indicators: Constant;Discomfort Pain Intervention(s): Monitored during session;Repositioned     Hand Dominance Right   Extremity/Trunk Assessment Upper Extremity Assessment Upper Extremity Assessment: Overall WFL for tasks assessed   Lower Extremity Assessment Lower Extremity Assessment: Defer to PT evaluation   Cervical / Trunk Assessment Cervical / Trunk Assessment: Kyphotic;Other exceptions Cervical / Trunk Exceptions: Prior back sxs and presents with forward flexion and rounded shoulders   Communication Communication Communication: No difficulties   Cognition Arousal/Alertness: Awake/alert Behavior During Therapy: WFL for tasks assessed/performed Overall Cognitive Status: Within Functional Limits for tasks assessed                                 General Comments: Feel pt is at baseline cognition for BADLs. Pt able to follow cues and demonstrated [facility] memory.    General Comments  Pt verbalizing frustration about NPO and doesn't know why he is NPO. Notified RN.     Exercises     Shoulder Instructions      Home Living Family/patient expects to be discharged to:: Private residence Living Arrangements: Alone Available Help at Discharge: Family;Available PRN/intermittently Type of Home: House Home Access: Stairs to enter Entergy CorporationEntrance Stairs-Number  of Steps: 4 Entrance Stairs-Rails: Can reach both Home Layout: One level     Bathroom Shower/Tub: Chief Strategy OfficerTub/shower unit   Bathroom Toilet: Standard     Home Equipment: Environmental consultantWalker - 2 wheels;Cane - single point;Bedside commode;Tub bench;Shower seat          Prior Functioning/Environment Level of Independence: Independent        Comments: ADLs, IADLs, driving        OT Problem List: Decreased activity tolerance;Impaired balance (sitting and/or standing);Impaired vision/perception;Decreased knowledge of precautions      OT Treatment/Interventions: Self-care/ADL training;Therapeutic exercise;Energy conservation;DME and/or AE instruction;Therapeutic activities;Patient/family education;Visual/perceptual remediation/compensation    OT Goals(Current goals can be found in the care plan section) Acute Rehab OT Goals Patient Stated Goal: "Go home" OT Goal Formulation: With patient Time For Goal Achievement: 11/09/18 Potential to Achieve Goals: Good  OT Frequency: Min 3X/week   Barriers to D/C: Decreased caregiver support  Lives alone. Reports family can assist with IADLs       Co-evaluation              AM-PAC OT "6 Clicks" Daily Activity     Outcome Measure Help from another person eating meals?: Total Help from another person taking care of personal grooming?: A Little Help from another person toileting, which includes using toliet, bedpan, or urinal?: A Little Help from another person bathing (including washing, rinsing, drying)?: A Little Help from another person to put on and taking off regular upper body clothing?: A  Little Help from another person to put on and taking off regular lower body clothing?: A Little 6 Click Score: 16   End of Session Equipment Utilized During Treatment: Rolling walker Nurse Communication: Mobility status  Activity Tolerance: Patient tolerated treatment well Patient left: in chair;with call bell/phone within reach  OT Visit Diagnosis:  Unsteadiness on feet (R26.81);Other abnormalities of gait and mobility (R26.89);Muscle weakness (generalized) (M62.81);Low vision, both eyes (H54.2)                Time: 1610-9604 OT Time Calculation (min): 33 min Charges:  OT General Charges $OT Visit: 1 Visit OT Evaluation $OT Eval Moderate Complexity: 1 Mod OT Treatments $Self Care/Home Management : 8-22 mins  Leanny Moeckel MSOT, OTR/L Acute Rehab Pager: [phone] Office: [phone]  Theodoro Grist Smayan Hackbart 10/26/2018, 8:37 AM

## 2018-10-26 NOTE — Progress Notes (Signed)
STROKE TEAM PROGRESS NOTE   INTERVAL HISTORY Up in the chair at the bedside. No family present. Pt still with HA today. He is seeing confetti like substances, green, yellow. Lost vision peripherally, still decreased on the R, just in his R eye.  Vitals:   10/26/18 0327 10/26/18 0509 10/26/18 0827 10/26/18 1237  BP: (!) 154/104 (!) 151/85 (!) 148/87 (!) 156/86  Pulse:  85 91 91  Resp: 16 18 19    Temp: 99.1 F (37.3 C) 98.3 F (36.8 C) 98.3 F (36.8 C) 98.2 F (36.8 C)  TempSrc: Oral Oral Oral Oral  SpO2: 97% 97% 99% 100%  Weight:      Height:   6' (1.829 m)     CBC:  Recent Labs  Lab 10/25/18 1625 10/26/18 0007  WBC 6.3 5.7  NEUTROABS 4.4  --   HGB 16.6 14.6  HCT 49.7 43.1  MCV 89.9 87.8  PLT 150 130*    Basic Metabolic Panel:  Recent Labs  Lab 10/26/18 0007 10/26/18 0552  NA 141 145  K 2.9* 2.8*  CL 105 105  CO2 26 33*  GLUCOSE 402* 138*  BUN 5* <5*  CREATININE 0.85 0.83  CALCIUM 7.9* 8.1*   Lipid Panel:     Component Value Date/Time   CHOL 75 10/26/2018 0007   TRIG 72 10/26/2018 0007   HDL 26 (L) 10/26/2018 0007   CHOLHDL 2.9 10/26/2018 0007   VLDL 14 10/26/2018 0007   LDLCALC 35 10/26/2018 0007   HgbA1c: No results found for: HGBA1C Urine Drug Screen:     Component Value Date/Time   LABOPIA NONE DETECTED 10/26/2018 0330   COCAINSCRNUR NONE DETECTED 10/26/2018 0330   LABBENZ NONE DETECTED 10/26/2018 0330   AMPHETMU NONE DETECTED 10/26/2018 0330   THCU NONE DETECTED 10/26/2018 0330   LABBARB NONE DETECTED 10/26/2018 0330    Alcohol Level No results found for: ETH  IMAGING Ct Head Wo Contrast  Result Date: 10/25/2018 CLINICAL DATA:  Severe headache, lost vision 1 week ago, cough, nausea, vomiting, history diabetes mellitus, hypertension EXAM: CT HEAD WITHOUT CONTRAST TECHNIQUE: Contiguous axial images were obtained from the base of the skull through the vertex without intravenous contrast. Sagittal and coronal MPR images reconstructed from  axial data set. COMPARISON:  None. FINDINGS: Brain: Generalized atrophy. Normal ventricular morphology. No midline shift or mass effect. Small vessel chronic ischemic changes of deep cerebral white matter. Question subcortical white matter infarct and RIGHT frontal lobe versus pronounced white matter disease. No intracranial hemorrhage, mass lesion or additional evidence of infarction. No extra-axial fluid collections. Vascular: Atherosclerotic calcifications of internal carotid arteries bilaterally at skull base Skull: Calvaria intact. Incomplete posterior arch C1, developmental variant. Sinuses/Orbits: Mucosal thickening LEFT maxillary sinus. Other: N/A IMPRESSION: Atrophy with small vessel chronic ischemic changes of deep cerebral white matter. 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 No additional intracranial abnormalities. Electronically Signed   By: Ulyses Southward M.D.   On: 10/25/2018 16:22   Mr Maxine Glenn Head Wo Contrast  Result Date: 10/26/2018 CLINICAL DATA:  Stroke follow-up. Headache and visual loss for 5-7 days. EXAM: MRI HEAD WITHOUT CONTRAST MRA HEAD WITHOUT CONTRAST TECHNIQUE: Multiplanar, multiecho pulse sequences of the brain and surrounding structures were obtained without intravenous contrast. Angiographic images of the head were obtained using MRA technique without contrast. COMPARISON:  Head CT 10/25/2018 FINDINGS: MRI HEAD FINDINGS BRAIN: There is no acute infarct, acute hemorrhage or mass effect. The midline structures are normal. Multifocal white matter hyperintensity, most commonly due  to chronic ischemic microangiopathy. Generalized atrophy without lobar predilection. Susceptibility-sensitive sequences show no chronic microhemorrhage or superficial siderosis. SKULL AND UPPER CERVICAL SPINE: The visualized skull base, calvarium, upper cervical spine and extracranial soft tissues are normal. SINUSES/ORBITS: No fluid levels or advanced mucosal thickening. No mastoid or middle  ear effusion. The orbits are normal. MRA HEAD FINDINGS POSTERIOR CIRCULATION: --Basilar artery: Normal. --Posterior cerebral arteries: Normal. Right PCA is partially supplied by the right P-comm. --Superior cerebellar arteries: Normal. --Inferior cerebellar arteries: Normal anterior and posterior inferior cerebellar arteries. ANTERIOR CIRCULATION: --Intracranial internal carotid arteries: Normal. --Anterior cerebral arteries: Normal. Both A1 segments are present. Patent anterior communicating artery. --Middle cerebral arteries: Normal. --Posterior communicating arteries: Present on the right, absent on the left. --Vertebral arteries: Left dominant.  Patent. IMPRESSION: 1. No acute intracranial abnormality. 2. Multifocal white matter disease most consistent with chronic ischemic microangiopathy. 3. Normal intracranial MRA. Electronically Signed   By: Deatra RobinsonKevin  Herman M.D.   On: 10/26/2018 04:29   Mr Brain Wo Contrast  Result Date: 10/26/2018 CLINICAL DATA:  Stroke follow-up. Headache and visual loss for 5-7 days. EXAM: MRI HEAD WITHOUT CONTRAST MRA HEAD WITHOUT CONTRAST TECHNIQUE: Multiplanar, multiecho pulse sequences of the brain and surrounding structures were obtained without intravenous contrast. Angiographic images of the head were obtained using MRA technique without contrast. COMPARISON:  Head CT 10/25/2018 FINDINGS: MRI HEAD FINDINGS BRAIN: There is no acute infarct, acute hemorrhage or mass effect. The midline structures are normal. Multifocal white matter hyperintensity, most commonly due to chronic ischemic microangiopathy. Generalized atrophy without lobar predilection. Susceptibility-sensitive sequences show no chronic microhemorrhage or superficial siderosis. SKULL AND UPPER CERVICAL SPINE: The visualized skull base, calvarium, upper cervical spine and extracranial soft tissues are normal. SINUSES/ORBITS: No fluid levels or advanced mucosal thickening. No mastoid or middle ear effusion. The orbits are  normal. MRA HEAD FINDINGS POSTERIOR CIRCULATION: --Basilar artery: Normal. --Posterior cerebral arteries: Normal. Right PCA is partially supplied by the right P-comm. --Superior cerebellar arteries: Normal. --Inferior cerebellar arteries: Normal anterior and posterior inferior cerebellar arteries. ANTERIOR CIRCULATION: --Intracranial internal carotid arteries: Normal. --Anterior cerebral arteries: Normal. Both A1 segments are present. Patent anterior communicating artery. --Middle cerebral arteries: Normal. --Posterior communicating arteries: Present on the right, absent on the left. --Vertebral arteries: Left dominant.  Patent. IMPRESSION: 1. No acute intracranial abnormality. 2. Multifocal white matter disease most consistent with chronic ischemic microangiopathy. 3. Normal intracranial MRA. Electronically Signed   By: Deatra RobinsonKevin  Herman M.D.   On: 10/26/2018 04:29   [place] Abdomen Complete  Result Date: 10/26/2018 CLINICAL DATA:  63 year old male with elevated LFTs. Patient with hepatitis-C and diabetes. EXAM: ABDOMEN ULTRASOUND COMPLETE COMPARISON:  01/18/2015 ultrasound FINDINGS: Gallbladder: Mild gallbladder wall thickening is unchanged. There is no evidence of cholelithiasis, sonographic Murphy sign or pericholecystic fluid. Common bile duct: Diameter: 3 mm. No intrahepatic or extrahepatic biliary dilatation. Liver: Slightly heterogeneous and increased echogenicity of the hepatic echotexture noted. No focal hepatic abnormalities are identified. Portal vein is patent on color Doppler imaging with normal direction of blood flow towards the liver. IVC: No abnormality visualized. Pancreas: Visualized portion unremarkable. Spleen: Size and appearance within normal limits. Right Kidney: Length: 10.7 cm. Echogenicity within normal limits. No mass or hydronephrosis visualized. Left Kidney: Length: 10.1 cm. Echogenicity within normal limits. No mass or hydronephrosis visualized. Abdominal aorta: No aneurysm visualized.  Other findings: None. IMPRESSION: 1. Slightly heterogeneous and echogenic hepatic echotexture which may represent hepatic steatosis. 2. Mild gallbladder wall thickening again noted without cholelithiasis or other signs of acute  cholecystitis, probably related to hepatic dysfunction. 3. No evidence of biliary dilatation. Electronically Signed   By: Harmon Pier M.D.   On: 10/26/2018 11:41   Vas US Carotid  Result Date: 10/26/2018 Carotid Arterial Duplex Study Indications: CVA. Performing Technologist: Jeb Levering RDMS, RVT  Examination Guidelines: A complete evaluation includes B-mode imaging, spectral Doppler, color Doppler, and power Doppler as needed of all accessible portions of each vessel. Bilateral testing is considered an integral part of a complete examination. Limited examinations for reoccurring indications may be performed as noted.  Right Carotid Findings: +----------+--------+-------+--------+---------------------+-------------------+           PSV cm/sEDV    StenosisDescribe             Comments                              cm/s                                                    +----------+--------+-------+--------+---------------------+-------------------+ CCA Prox  85      7                                                       +----------+--------+-------+--------+---------------------+-------------------+ CCA Distal88      16                                                      +----------+--------+-------+--------+---------------------+-------------------+ ICA Prox  66      12     1-39%   smooth and           atypical appearance                                  homogeneous                              +----------+--------+-------+--------+---------------------+-------------------+ ICA Distal71      18                                                      +----------+--------+-------+--------+---------------------+-------------------+ ECA        130     19                                                      +----------+--------+-------+--------+---------------------+-------------------+ +----------+--------+-------+----------------+-------------------+           PSV cm/sEDV cmsDescribe        Arm Pressure (mmHG) +----------+--------+-------+----------------+-------------------+ [id number]             Multiphasic,  WNL                    +----------+--------+-------+----------------+-------------------+ +---------+--------+--------+------+ VertebralPSV cm/sEDV cm/sAbsent +---------+--------+--------+------+  Left Carotid Findings: +----------+--------+--------+--------+------------+--------+           PSV cm/sEDV cm/sStenosisDescribe    Comments +----------+--------+--------+--------+------------+--------+ CCA Prox  99      15                                   +----------+--------+--------+--------+------------+--------+ CCA Distal65      18                                   +----------+--------+--------+--------+------------+--------+ ICA Prox  24      7       1-39%   heterogenous         +----------+--------+--------+--------+------------+--------+ ICA Distal57      15                                   +----------+--------+--------+--------+------------+--------+ ECA       147     21                                   +----------+--------+--------+--------+------------+--------+ +----------+--------+--------+----------------+-------------------+ SubclavianPSV cm/sEDV cm/sDescribe        Arm Pressure (mmHG) +----------+--------+--------+----------------+-------------------+           188             Multiphasic, WNL                    +----------+--------+--------+----------------+-------------------+ +---------+--------+--+--------+--+---------+ VertebralPSV cm/s48EDV cm/s12Antegrade +---------+--------+--+--------+--+---------+  Summary: Right Carotid: Velocities in the  right ICA are consistent with a 1-39% stenosis. Left Carotid: Velocities in the left ICA are consistent with a 1-39% stenosis.  *See table(s) above for measurements and observations.     Preliminary     PHYSICAL EXAM- unchanged General: Awake alert in no distress HEENT: Normocephalic atraumatic dry mucous membranes Lungs: Clear to auscultation Cardiovascular: S1 to regular rate rhythm Abdomen: Soft nondistended nontender Extremities: Warm well perfused with intact pulses Neurological exam Mental status: Awake alert oriented x3. Speech is non-dysarthric. Naming repetition comprehension and fluency are intact. Cranial nerves: Pupils equal round reactive to light bilaterally, extraocular movements appear intact.  Visual acuity is reduced to light perception in both eyes.  At one point, he said that he could not see the right visual field but on covering each eye separately he denies being able to see any finger movement and can only see light.  Face is symmetric.  Auditory acuity is intact.  Palate midline.  Tongue midline. Motor exam: 5/5 with no vertical drift in any of the extremities. Sensory exam: Intact light touch all over with no extinction Coordination: Finger-nose-finger is intact bilaterally.   ASSESSMENT/PLAN Mr. Xavier Kennedy is a 63 y.o. male with history of diabetes, hepatitis C, noncompliant to medications, hypertension, hyperlipidemia, [facility] for severe headache and visual loss for 5 to 7 days.  CT showed R frontal infarct.   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 Small vessel disease. Atrophy.   MRI  No acute stroke.  Small vessel disease.   MRA  normal Carotid Doppler  pending   2D Echo  pending   LDL 35  HgbA1c pending   SCDs for VTE prophylaxis  aspirin 81 mg daily prior to admission, now on aspirin 325 mg daily. Decrease back to 81 and continue home dose  at d/c  Therapy recommendations:  pending   Disposition:  pending   OP visual field testing by opthalmology recommended   Hypertension  150-180s . 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 After hours, contact General Neurology

## 2018-10-26 NOTE — Progress Notes (Signed)
SLP Cancellation Note  Patient Details Name: Xavier Kennedy MRN: [id number] DOB: [birth year]   Cancelled treatment:       Reason Eval/Treat Not Completed: Patient at procedure or test/unavailable. Pt off unit at this time. SLP will follow up.    Scheryl Marten 10/26/2018, 10:48 AM

## 2018-10-26 NOTE — Progress Notes (Signed)
SLP Cancellation Note  Patient Details Name: Xavier Kennedy MRN: [id number] DOB: Mar 01, 1956   Cancelled treatment:       Reason Eval/Treat Not Completed: SLP screened, no needs identified, will sign off. Pt reported that his speech, language, and cognition are currently at baseline and OT did not observe any deficits in cognition.     Scheryl Marten 10/26/2018, 4:35 PM

## 2018-10-27 ENCOUNTER — Inpatient Hospital Stay (HOSPITAL_COMMUNITY): Payer: Medicare Other

## 2018-10-27 DIAGNOSIS — H547 Unspecified visual loss: Secondary | ICD-10-CM

## 2018-10-27 DIAGNOSIS — I639 Cerebral infarction, unspecified: Secondary | ICD-10-CM

## 2018-10-27 LAB — HEPATITIS PANEL, ACUTE
HCV Ab: >11 s/co ratio — ABNORMAL HIGH (ref 0.0–0.9)
HEP A IGM: NEGATIVE
Hep B C IgM: NEGATIVE
Hepatitis B Surface Ag: NEGATIVE

## 2018-10-27 LAB — BASIC METABOLIC PANEL
Anion gap: 13 (ref 5–15)
BUN: 5 mg/dL — ABNORMAL LOW (ref 8–23)
CO2: 24 mmol/L (ref 22–32)
Calcium: 8 mg/dL — ABNORMAL LOW (ref 8.9–10.3)
Chloride: 107 mmol/L (ref 98–111)
Creatinine, Ser: 0.71 mg/dL (ref 0.61–1.24)
GFR calc Af Amer: >60 mL/min (ref >60)
Glucose, Bld: 135 mg/dL — ABNORMAL HIGH (ref 70–99)
Potassium: 2.8 mmol/L — ABNORMAL LOW (ref 3.5–5.1)
Sodium: 144 mmol/L (ref 135–145)

## 2018-10-27 LAB — GLUCOSE, CAPILLARY
Glucose-Capillary: 206 mg/dL — ABNORMAL HIGH (ref 70–99)
Glucose-Capillary: 296 mg/dL — ABNORMAL HIGH (ref 70–99)
Glucose-Capillary: 366 mg/dL — ABNORMAL HIGH (ref 70–99)
Glucose-Capillary: 373 mg/dL — ABNORMAL HIGH (ref 70–99)
Glucose-Capillary: 53 mg/dL — ABNORMAL LOW (ref 70–99)
Glucose-Capillary: 97 mg/dL (ref 70–99)

## 2018-10-27 LAB — MAGNESIUM: Magnesium: 1.7 mg/dL (ref 1.7–2.4)

## 2018-10-27 LAB — CBC
HCT: 45.6 % (ref 39.0–52.0)
Hemoglobin: 15.2 g/dL (ref 13.0–17.0)
MCH: 29.7 pg (ref 26.0–34.0)
MCHC: 33.3 g/dL (ref 30.0–36.0)
MCV: 89.1 fL (ref 80.0–100.0)
Platelets: 145 10*3/uL — ABNORMAL LOW (ref 150–400)
RBC: 5.12 MIL/uL (ref 4.22–5.81)
RDW: 12.1 % (ref 11.5–15.5)
WBC: 5.8 10*3/uL (ref 4.0–10.5)
nRBC: 0 % (ref 0.0–0.2)

## 2018-10-27 LAB — HEMOGLOBIN A1C
Hgb A1c MFr Bld: >15.5 % — ABNORMAL HIGH (ref 4.8–5.6)
Mean Plasma Glucose: >398 mg/dL

## 2018-10-27 LAB — ECHOCARDIOGRAM COMPLETE
Height: 72 in
WEIGHTICAEL: 3200 [oz_av]

## 2018-10-27 MED ORDER — METFORMIN HCL 500 MG PO TABS: 500.0000 mg | ORAL_TABLET | Freq: Two times a day (BID) | ORAL | Status: DC | PRN

## 2018-10-27 MED ORDER — MAGNESIUM SULFATE 2 GM/50ML IV SOLN
2.0000 g | Freq: Once | INTRAVENOUS | Status: AC
  Administered 2018-10-27: 2 g via INTRAVENOUS
  Filled 2018-10-27: qty 50

## 2018-10-27 MED ORDER — KETOROLAC TROMETHAMINE 15 MG/ML IJ SOLN
15.0000 mg | Freq: Four times a day (QID) | INTRAMUSCULAR | Status: DC | PRN
  Administered 2018-10-27: 15 mg via INTRAVENOUS
  Filled 2018-10-27: qty 1

## 2018-10-27 MED ORDER — LISINOPRIL 20 MG PO TABS
40.0000 mg | ORAL_TABLET | Freq: Every day | ORAL | Status: DC
  Administered 2018-10-27 – 2018-10-28 (×2): 40 mg via ORAL
  Filled 2018-10-27 (×2): qty 2

## 2018-10-27 MED ORDER — INSULIN STARTER KIT- PEN NEEDLES (ENGLISH)
1.0000 | Freq: Once | Status: AC
  Administered 2018-10-27: 1
  Filled 2018-10-27: qty 1

## 2018-10-27 MED ORDER — CANAGLIFLOZIN 100 MG PO TABS
100.0000 mg | ORAL_TABLET | Freq: Every day | ORAL | Status: DC
  Administered 2018-10-27 – 2018-10-28 (×2): 100 mg via ORAL
  Filled 2018-10-27 (×2): qty 1

## 2018-10-27 MED ORDER — LIVING WELL WITH DIABETES BOOK
Freq: Once | Status: AC
  Administered 2018-10-27: 15:00:00
  Filled 2018-10-27: qty 1

## 2018-10-27 MED ORDER — POTASSIUM CHLORIDE CRYS ER 20 MEQ PO TBCR
40.0000 meq | EXTENDED_RELEASE_TABLET | Freq: Two times a day (BID) | ORAL | Status: AC
  Administered 2018-10-27 (×2): 40 meq via ORAL
  Filled 2018-10-27 (×2): qty 2

## 2018-10-27 NOTE — Progress Notes (Signed)
Occupational Therapy Progress Note  Discussed visual field deficit with pt and related safety concerns.  Pt is aware that he is unsafe to drive.  Discussed need to have someone with him at all times initially when outside and in community and that family should walk on his Rt side.  Pt continues to see boxes in Rt visual field.  Discussed scan strategies.  Will need further education on use of anchors and scanning.     10/27/18 1520  OT Visit Information  Last OT Received On 10/27/18  Assistance Needed +1  History of Present Illness 63 y.o. male with history of hypertension, diabetes mellitus, s/p back sx, s/p hip replacements, and hepatitis C presents to the ER with complaints of visual loss mostly on the right side visual field for the last 1 week.  Also has benign left temporal headache at the same time. MRI showing no acute abnormalities. Patient reports several falls leading up to his admission into the hospital.  Precautions  Precautions Fall;Other (comment)  Precaution Comments Right side vision loss  Pain Assessment  Pain Assessment Faces  Faces Pain Scale 4  Pain Location headache   Pain Descriptors / Indicators Constant;Aching;Throbbing  Pain Intervention(s) Monitored during session  Cognition  Arousal/Alertness Awake/alert  Behavior During Therapy WFL for tasks assessed/performed  Overall Cognitive Status Within Functional Limits for tasks assessed  General Comments Pt demonstrates good safety awareness and anticipatory awareness of deficits   Upper Extremity Assessment  Upper Extremity Assessment Overall WFL for tasks assessed  Lower Extremity Assessment  Lower Extremity Assessment Overall WFL for tasks assessed  Balance  Overall balance assessment Needs assistance  Sitting-balance support No upper extremity supported;Feet supported  Sitting balance-Leahy Scale Fair  Standing balance support During functional activity;Single extremity supported  Standing balance-Leahy  Scale Fair  Vision- Assessment  Additional Comments Pt continues to report seeing "boxes" in Rt visual fields.  Discussed need to have someone with him when in the community and when walking outside due to field deficit.  He is aware that he is unsafe to drive, and will speak with his family about providing transportation and increased assist at discharge   Transfers  Overall transfer level Needs assistance  Equipment used None  Transfers Sit to/from Stand  Sit to Stand Min guard  OT - End of Session  Activity Tolerance Patient tolerated treatment well  Patient left in chair;with call bell/phone within reach  Nurse Communication Mobility status  OT Assessment/Plan  OT Plan Discharge plan remains appropriate;Discharge plan needs to be updated  OT Visit Diagnosis Unsteadiness on feet (R26.81);Other abnormalities of gait and mobility (R26.89);Muscle weakness (generalized) (M62.81);Low vision, both eyes (H54.2)  OT Frequency (ACUTE ONLY) Min 3X/week  Follow Up Recommendations Home health OT;Supervision - Intermittent  OT Equipment None recommended by OT  AM-PAC OT "6 Clicks" Daily Activity Outcome Measure (Version 2)  Help from another person eating meals? 4  Help from another person taking care of personal grooming? 3  Help from another person toileting, which includes using toliet, bedpan, or urinal? 3  Help from another person bathing (including washing, rinsing, drying)? 3  Help from another person to put on and taking off regular upper body clothing? 3  Help from another person to put on and taking off regular lower body clothing? 3  6 Click Score 19  OT Goal Progression  Progress towards OT goals Progressing toward goals  OT Time Calculation  OT Start Time (ACUTE ONLY) 1214  OT Stop Time (ACUTE ONLY)  1226  OT Time Calculation (min) 12 min  OT General Charges  $OT Visit 1 Visit  OT Treatments  $Self Care/Home Management  8-22 mins  Jeani HawkingWendi Cashmere Dingley, OTR/L Acute Rehabilitation  Services Pager [phone] Office [phone]

## 2018-10-27 NOTE — Plan of Care (Signed)
  RD consulted for nutrition education regarding diabetes.   Lab Results  Component Value Date   HGBA1C >15.5 (H) 10/26/2018    RD provided "Carbohydrate Counting for People with Diabetes" handout from the Academy of [employer]etics. Discussed different food groups and their effects on blood sugar, emphasizing carbohydrate-containing foods. Provided list of carbohydrates and recommended serving sizes of common foods.  Discussed importance of controlled and consistent carbohydrate intake throughout the day. Provided examples of ways to balance meals/snacks and encouraged intake of high-fiber, whole grain complex carbohydrates. Pt reports consuming large amounts of sugar sweetened beverages (Kool-aid, regular sodas, fruit juices) as well as concentrated sweets daily (chocolate cakes, donuts). Discussed healthier alternatives to his concentrated sweets and discussed importance of monitoring portion sizes. Discussed diabetic friendly drink options.Teach back method used.  Expect good compliance.  Body mass index is 27.12 kg/m. Pt meets criteria for overweight based on current BMI.  Current diet order is heart healthy/carbohydrate modified, patient is consuming approximately 60-100% of meals at this time. Appetite good currently and PTA with no other difficulties. Labs and medications reviewed. No further nutrition interventions warranted at this time. RD contact information provided. If additional nutrition issues arise, please re-consult RD.  Roslyn Smiling, MS, RD, LDN Pager # [phone] After hours/ weekend pager # [phone]

## 2018-10-27 NOTE — Progress Notes (Signed)
  Echocardiogram 2D Echocardiogram has been performed.  Tye Savoy 10/27/2018, 10:42 AM

## 2018-10-27 NOTE — Progress Notes (Signed)
Occupational Therapy Treatment Patient Details Name: Huntley EstelleStanley M Cedeno MRN: [id number] DOB: 02/20/1956 Today's Date: 10/27/2018    History of present illness (P) 63 y.o. male with history of hypertension, diabetes mellitus, s/p back sx, s/p hip replacements, and hepatitis C presents to the ER with complaints of visual loss mostly on the right side visual field for the last 1 week.  Also has benign left temporal headache at the same time. MRI showing no acute abnormalities. Patient reports several falls leading up to his admission into the hospital.   OT comments  Pt overall min guard assist for functional mobility and transfers using his single point cane.  He still exhibits significant right visual field deficit with reports of color distortion in the right visual field as well, even with his eyes closed.  He also reports blurry vision much worse since admission both in near and far fields.  Educated pt on visual compensation techniques for home with increased head turns as well as the need for highlighted tape on the microwave, stove, washer, and dryer as needed. Feel he may also need referral to services for the blind if this does not improve.  Pt reports his nephew will be with him when he discharges initially.  Based on this recommend HHOT at discharge.  Will continue to follow in acute care.       Follow Up Recommendations  Home health OT;Supervision/Assistance - 24 hour    Equipment Recommendations  None recommended by OT    Recommendations for Other Services      Precautions / Restrictions Precautions Precautions: (P) Fall;Other (comment) Precaution Comments: (P) Right side vision loss Restrictions Weight Bearing Restrictions: No       Mobility Bed Mobility                  Transfers Overall transfer level: Needs assistance Equipment used: Straight cane Transfers: Sit to/from Stand Sit to Stand: Min guard Stand pivot transfers: Min guard       General transfer  comment: Pt ambulated from his room around the nurses station with min guard assist while scanning left and right for colors that therapist told him to locate.  Slower gait speed than normal secondary to having to divide his attention.    Balance Overall balance assessment: Needs assistance Sitting-balance support: No upper extremity supported;Feet supported Sitting balance-Leahy Scale: Fair     Standing balance support: During functional activity;Single extremity supported Standing balance-Leahy Scale: Fair                             ADL either performed or assessed with clinical judgement   ADL Overall ADL's : Needs assistance/impaired     Grooming: Supervision/safety;Standing                   Toilet Transfer: Min guard;RW       Tub/ Shower Transfer: Min guard;Rolling walker   Functional mobility during ADLs: Min guard;Cane General ADL Comments: Pt currently min guard assist with use of the single point cane.  Educated pt on vision compensation techniques of scanning right of midline with a circular pattern when ambualting or out in the envirionment.  Also discussed modifications at home such as using a magnifier for reading his mail and small print as well as placing colored tape on objects he needs that are used daily/weekly such as the washing machine, stove, microwave, ect.  Pt still with significant peripheral vision loss as  well as on and off color distortion in the right field with eyes open or closed.  Recommend HHOT for further treamtent as well as neuro opthamologist consult.  Pt able to ambulated with single point cane and locate 90% of objects/colors located in the hallway, however very slow gait speed noted.      Vision Baseline Vision/History: Wears glasses Wears Glasses: Reading only Patient Visual Report: Blurring of vision Vision Assessment?: Yes Eye Alignment: Within Functional Limits Ocular Range of Motion: Within Functional  Limits Alignment/Gaze Preference: Within Defined Limits Tracking/Visual Pursuits: Decreased smoothness of horizontal tracking;Decreased smoothness of vertical tracking;Impaired - to be further tested in functional context(loss of visual fixation when tracking across midline from the left to the right)   Perception     Praxis      Cognition Arousal/Alertness: (P) Awake/alert(Simultaneous filing. User may not have seen previous data.) Behavior During Therapy: (P) WFL for tasks assessed/performed(Simultaneous filing. User may not have seen previous data.) Overall Cognitive Status: (P) Within Functional Limits for tasks assessed(Simultaneous filing. User may not have seen previous data.)                                 General Comments: (P) Pt demonstrates good safety awareness and anticipatory awareness of deficits                    Pertinent Vitals/ Pain       Pain Assessment: (P) Faces(Simultaneous filing. User may not have seen previous data.) Faces Pain Scale: (P) Hurts little more(Simultaneous filing. User may not have seen previous data.) Pain Location: (P) headache (Simultaneous filing. User may not have seen previous data.) Pain Descriptors / Indicators: (P) Constant;Aching;Throbbing(Simultaneous filing. User may not have seen previous data.) Pain Intervention(s): (P) Monitored during session(Simultaneous filing. User may not have seen previous data.)         Frequency  Min 3X/week        Progress Toward Goals  OT Goals(current goals can now be found in the care plan section)  Progress towards OT goals: Progressing toward goals     Plan Discharge plan remains appropriate       AM-PAC OT "6 Clicks" Daily Activity     Outcome Measure   Help from another person eating meals?: None Help from another person taking care of personal grooming?: A Little Help from another person toileting, which includes using toliet, bedpan, or urinal?: A Little Help  from another person bathing (including washing, rinsing, drying)?: A Little Help from another person to put on and taking off regular upper body clothing?: A Little Help from another person to put on and taking off regular lower body clothing?: A Little 6 Click Score: 19    End of Session Equipment Utilized During Treatment: Gait belt  OT Visit Diagnosis: Low vision, both eyes (H54.2);Muscle weakness (generalized) (M62.81);Unsteadiness on feet (R26.81);Pain Pain - part of body: (Headache)   Activity Tolerance Patient tolerated treatment well   Patient Left in chair;with call bell/phone within reach;with chair alarm set   Nurse Communication Mobility status        Time: 1441-1511 OT Time Calculation (min): 30 min  Charges: OT General Charges $OT Visit: 1 Visit OT Treatments $Therapeutic Activity: 23-37 mins   Aryam Zhan OTR/L 10/27/2018, 3:24 PM

## 2018-10-27 NOTE — Progress Notes (Signed)
Inpatient Diabetes Program Recommendations  AACE/ADA: New Consensus Statement on Inpatient Glycemic Control (2015)  Target Ranges:  Prepandial:   less than 140 mg/dL      Peak postprandial:   less than 180 mg/dL (1-2 hours)      Critically ill patients:  140 - 180 mg/dL   Lab Results  Component Value Date   GLUCAP 373 (H) 10/27/2018    Review of Glycemic Control Results for Fossett, Adalid M (MRN [id number]) as of 10/27/2018 13:07  Ref. Range 10/26/2018 22:41 10/26/2018 23:12 10/27/2018 03:49 10/27/2018 08:03 10/27/2018 11:25  Glucose-Capillary Latest Ref Range: 70 - 99 mg/dL 230 (H) 176 (H) 97 206 (H) 373 (H)   Diabetes history:  DM 2 Outpatient Diabetes medications:  Metformin 500 mg bid Current orders for Inpatient glycemic control:  Novolog sensitive q 4 hours, Lantus 10 units daily Metformin 500 mg bid, Invokana 100 mg daily Inpatient Diabetes Program Recommendations:    Please consider increasing Lantus to 18 units daily.  Also consider adding Novolog meal coverage 3 units tid with meals (hold if patient eats less than 50%).  Blood sugars have been up and down.  It appears that he will need insulin at home as well.  Will have RN's begin insulin teaching and order insulin starter kit.  Awaiting A1C results as well.    Thanks,  Jenny Simpson, RN, BC-ADM Inpatient Diabetes Coordinator Pager [phone] (8a-5p)     

## 2018-10-27 NOTE — Plan of Care (Signed)
  Problem: Education: Goal: Knowledge of General Education information will improve Description Including pain rating scale, medication(s)/side effects and non-pharmacologic comfort measures 10/27/2018 0224 by Carylon Perches, LPN Outcome: Progressing 10/27/2018 0221 by Carylon Perches, LPN Outcome: Progressing   Problem: Education: Goal: Knowledge of General Education information will improve Description Including pain rating scale, medication(s)/side effects and non-pharmacologic comfort measures 10/27/2018 0224 by Carylon Perches, LPN Outcome: Progressing 10/27/2018 0221 by Carylon Perches, LPN Outcome: Progressing   Problem: Health Behavior/Discharge Planning: Goal: Ability to manage health-related needs will improve 10/27/2018 0224 by Carylon Perches, LPN Outcome: Progressing 10/27/2018 0221 by Carylon Perches, LPN Outcome: Progressing   Problem: Clinical Measurements: Goal: Ability to maintain clinical measurements within normal limits will improve Outcome: Progressing Goal: Will remain free from infection 10/27/2018 0224 by Carylon Perches, LPN Outcome: Progressing 10/27/2018 0221 by Carylon Perches, LPN Outcome: Progressing Goal: Diagnostic test results will improve 10/27/2018 0224 by Carylon Perches, LPN Outcome: Progressing 10/27/2018 0221 by Carylon Perches, LPN Outcome: Progressing Goal: Respiratory complications will improve 10/27/2018 0224 by Carylon Perches, LPN Outcome: Progressing 10/27/2018 0221 by Carylon Perches, LPN Outcome: Progressing Goal: Cardiovascular complication will be avoided 10/27/2018 0224 by Carylon Perches, LPN Outcome: Progressing 10/27/2018 0221 by Carylon Perches, LPN Outcome: Progressing   Problem: Activity: Goal: Risk for activity intolerance will decrease 10/27/2018 0224 by Carylon Perches, LPN Outcome: Progressing 10/27/2018 0221 by Carylon Perches, LPN Outcome: Progressing   Problem: Nutrition: Goal:  Adequate nutrition will be maintained 10/27/2018 0224 by Carylon Perches, LPN Outcome: Progressing 10/27/2018 0221 by Carylon Perches, LPN Outcome: Progressing   Problem: Coping: Goal: Level of anxiety will decrease 10/27/2018 0224 by Carylon Perches, LPN Outcome: Progressing 10/27/2018 0221 by Carylon Perches, LPN Outcome: Progressing   Problem: Elimination: Goal: Will not experience complications related to bowel motility 10/27/2018 0224 by Carylon Perches, LPN Outcome: Progressing 10/27/2018 0221 by Carylon Perches, LPN Outcome: Progressing Goal: Will not experience complications related to urinary retention Outcome: Progressing   Problem: Pain Managment: Goal: General experience of comfort will improve 10/27/2018 0224 by Carylon Perches, LPN Outcome: Progressing 10/27/2018 0221 by Carylon Perches, LPN Outcome: Progressing   Problem: Safety: Goal: Ability to remain free from injury will improve 10/27/2018 0224 by Carylon Perches, LPN Outcome: Progressing 10/27/2018 0221 by Carylon Perches, LPN Outcome: Progressing   Problem: Skin Integrity: Goal: Risk for impaired skin integrity will decrease 10/27/2018 0224 by Carylon Perches, LPN Outcome: Progressing 10/27/2018 0221 by Carylon Perches, LPN Outcome: Progressing

## 2018-10-27 NOTE — Progress Notes (Signed)
Results for LATREVION, SABOL (MRN [id number]) as of 10/27/2018 16:19  Ref. Range 10/26/2018 00:07  Hemoglobin A1C Latest Ref Range: 4.8 - 5.6 % >15.5 (H)   Saw patient and reviewed results of A1C.  Also discussed his need for insulin at home.  He states that he does have a meter at home, but he was not using it "b/c he didn't think his diabetes was that bad".  We discussed insulin and how it helps glucose get into cells.  I allowed patient to see insulin pen and showed him how to use including applying needle, 2 unit prime, how to dial dose and how to administer (hold in place for 6-10 seconds).  We also reviewed administration sites and the importance of rotation.  Patient was able to see the numbers on the insulin pen and correctly dial dose.  He practiced x2 with practice insulin pen.  We also reviewed signs and symptoms of high and low blood sugars and how to treat.  Patient was able to correctly teach back hypo symptoms and how to treat.  Interestingly his A1C went from 7.1% to 15.5%.  He will likely need 4 shots a day but is going to need lots of help and assistance.  He was able to correctly teach back education.  Will ask RN to allow patient to administer all insulin injections while in the hospital.    Thanks  Beryl Meager, RN, BC-ADM Inpatient Diabetes Coordinator Pager [phone] (8a-5p)

## 2018-10-27 NOTE — Plan of Care (Signed)
  Problem: Health Behavior/Discharge Planning: Goal: Ability to manage health-related needs will improve Outcome: Progressing   Problem: Clinical Measurements: Goal: Will remain free from infection Outcome: Progressing   Problem: Clinical Measurements: Goal: Diagnostic test results will improve Outcome: Progressing   Problem: Clinical Measurements: Goal: Respiratory complications will improve Outcome: Progressing   Problem: Clinical Measurements: Goal: Cardiovascular complication will be avoided Outcome: Progressing   Problem: Activity: Goal: Risk for activity intolerance will decrease Outcome: Progressing   Problem: Nutrition: Goal: Adequate nutrition will be maintained Outcome: Progressing   Problem: Coping: Goal: Level of anxiety will decrease Outcome: Progressing   Problem: Elimination: Goal: Will not experience complications related to bowel motility Outcome: Progressing   Problem: Pain Managment: Goal: General experience of comfort will improve Outcome: Progressing   Problem: Skin Integrity: Goal: Risk for impaired skin integrity will decrease Outcome: Progressing   Problem: Safety: Goal: Ability to remain free from injury will improve Outcome: Progressing

## 2018-10-27 NOTE — Progress Notes (Signed)
PROGRESS NOTE  ASHE GAGO  LNL:[id number] DOB: Oct 16, 1955 DOA: 10/25/2018 PCP: Beckie Salts, MD   Brief Narrative: Xavier Kennedy is a 63 y.o. male with a history of recently diagnosed T2DM, hepatitis C, HTN, HLD, and medication nonadherence who presented to [facility] with 5-7 days of left-sided headache. He had decreased visual fields on right, reported inability to see light in color or shapes properly. Blood sugar was >655m/dl. CT head showed atrophy with chronic small vessel ischemic changes. Formal report included mention of probably subcortical what matter infarct of the right frontal lobe. [facility] Neurology consulted, recommended stroke work up, therapy consults, and ESR, CRP to evaluate for GCA. MRI showed no acute stroke and inflammatory markers were only mildly elevated. He continued to have peripheral vision loss in the right eye and seeing colorful confetti in his field of vision bilaterally. Ophthalmology was consulted, felt there were no intraocular explanations for symptoms. Blood sugar on arrival was treated with insulin with improvement though CBGs have remained labile, requiring higher doses of insulin. Potassium has been given though the patient has refractory hypokalemia.    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 Still with deficits. ?w/left-sided headache consider migraine w/aura vs. glaucoma. GCA considered, though CRP only 1.2 and ESR similarly mildly elevated, not typical of GCA.  - MRI/MRA negative, carotid dopplers without significant stenosis. Echocardiogram still pending. - Restart low dose  aspirin per neurology. - Still persistent severe deficits. Patient lives alone. Not a candidate for inpatient rehabilitation, so plan is to discharge home. CM consulted, working on getting family support for assistance at discharge, arranging maximal home health.  - Ophthalmology, Dr. 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 Recommendation is for outpatient automated visual field testing.  Poorly-controlled T2DM: Previously only on metformin, reports incomplete adherence. Severely hyperglycemic on presentation, CBGs remain brittle between 97 and 664 within < 12 hours.  - HbA1c has been pending, will reorder to help guide longer term medications.  - Start invokana as the patient has insurance - No further procedures, etc. Will restart home metformin at 5024mpo BID. Since he wasn't taking this regularly, will monitor for tolerance prior to increasing dose.  - Continue lantus 10u, sensitive SSI, and carb-modified diet.  - Diabetes coordinator following. Dietitian consulted. Pt will benefit from education and supplies at discharge.  - Note no diabetic retinopathy on exam, understandable as this was recently diagnosed.   Hypokalemia: Severe, refractory to replacement. - Repeat KDUR today and add magnesium 2g. Anticipate need for daily KDUR and mag at home.  - Recheck in AM.  HTN:  - Continue amlodipine - Increase lisinopril with elevated BPs, stable creatinine, and low potassium.   Chronic hepatitis C with elevated LFTs. U/S only shows hepatic steatosis, possibly from diabetes.  - Outpatient follow up recommended  Right eye cataract: No acute intervention required, does not explain visual deficits.  DVT prophylaxis: Lovenox Code Status: Full Family Communication: None at bedside Disposition Plan: Home once blood sugars reasonably stabilized and no longer severely  hypokalemic.  Possibly 2/14.  Consultants:   Neurology  Ophthalmology, Dr. Alanda Slim  Procedures:   Echocardiogram  Antimicrobials:  None   Subjective: Left eye vision is slightly improved this morning but was bad last night. Left-sided intermittent severe, pounding headache is improved this morning. Better with cover over his head. Peripheral vision still absent per pt.   Objective: Vitals:   10/26/18 2314 10/27/18 0351 10/27/18 0700 10/27/18 0830  BP: (!) 143/81 (!) 168/91  (!) 146/91  Pulse: 98 83  96  Resp: 17 17  17   Temp: 98.8 F (37.1 C) 99.1 F (37.3 C)  99.5 F (37.5 C)  TempSrc: Oral Oral  Oral  SpO2: 100% 99%  98%  Weight:      Height:   6' (1.829 m)     Intake/Output Summary (Last 24 hours) at 10/27/2018 1212 Last data filed at 10/27/2018 0805 Gross per 24 hour  Intake 2497.97 ml  Output 3250 ml  Net -752.03 ml   Filed Weights   10/25/18 1530 10/25/18 2156  Weight: 96.6 kg 90.7 kg   Gen: 63 y.o. male in no distress Pulm: Nonlabored breathing room air. Clear. CV: Regular rate and rhythm. No murmur, rub, or gallop. No JVD, no dependent edema. GI: Abdomen soft, non-tender, non-distended, with normoactive bowel sounds.  Ext: Warm, no deformities Skin: No rashes, lesions or ulcers on visualized skin. Neuro: Alert and oriented. Impaired visual acuity bilaterally R > L grossly with decreased field of vision bilaterally R > L. No new focal neurological deficits. Psych: Judgement and insight appear fair. Mood euthymic & affect congruent. Behavior is appropriate.    Data Reviewed: I have personally reviewed following labs and imaging studies  CBC: Recent Labs  Lab 10/25/18 1625 10/26/18 0007 10/27/18 0544  WBC 6.3 5.7 5.8  NEUTROABS 4.4  --   --   HGB 16.6 14.6 15.2  HCT 49.7 43.1 45.6  MCV 89.9 87.8 89.1  PLT 150 130* 016*   Basic Metabolic Panel: Recent Labs  Lab 10/25/18 1625 10/26/18 0007 10/26/18 0552 10/26/18 1841 10/27/18 0544  NA 134*  141 145 137 144  K 3.2* 2.9* 2.8* 3.8 2.8*  CL 96* 105 105 105 107  CO2 20* 26 33* 20* 24  GLUCOSE 629* 402* 138* 664* 135*  BUN 9 5* <5* 7* 5*  CREATININE 0.95 0.85 0.83 1.03 0.71  CALCIUM 8.5* 7.9* 8.1* 7.9* 8.0*  MG  --   --   --  1.7 1.7   GFR: Estimated Creatinine Clearance: 103.7 mL/min (by C-G formula based on SCr of 0.71 mg/dL). Liver Function Tests: Recent Labs  Lab 10/25/18 1625 10/26/18 0552  AST 147* 143*  ALT 151* 134*  ALKPHOS 157* 125  BILITOT 1.2 1.0  PROT 7.8 7.2  ALBUMIN 3.6 3.1*   Recent Labs  Lab 10/26/18 0552  LIPASE 45   No results for input(s): AMMONIA in the last 168 hours. Coagulation Profile: Recent Labs  Lab 10/25/18 1625  INR 0.94   Cardiac Enzymes: Recent Labs  Lab 10/25/18 1625  TROPONINI <0.03   BNP (last 3 results) No results for input(s): PROBNP in the last 8760 hours. HbA1C: No results for input(s): HGBA1C in the last 72 hours. CBG: Recent Labs  Lab 10/26/18 2241 10/26/18 2312 10/27/18 0349 10/27/18 0803 10/27/18 1125  GLUCAP 230* 176* 97 206* 373*   Lipid Profile: Recent Labs    10/26/18 0007  CHOL 75  HDL 26*  LDLCALC 35  TRIG 72  CHOLHDL 2.9  Thyroid Function Tests: No results for input(s): TSH, T4TOTAL, FREET4, T3FREE, THYROIDAB in the last 72 hours. Anemia Panel: No results for input(s): VITAMINB12, FOLATE, FERRITIN, TIBC, IRON, RETICCTPCT in the last 72 hours. Urine analysis:    Component Value Date/Time   Benjamin Stain NEGATIVE 10/26/2018 2009   No results found for this or any previous visit (from the past 240 hour(s)).    Radiology Studies: Ct Head Wo Contrast  Result Date: 10/25/2018 CLINICAL DATA:  Severe headache, lost vision 1 week ago, cough, nausea, vomiting, history diabetes mellitus, hypertension EXAM: CT HEAD WITHOUT CONTRAST TECHNIQUE: Contiguous axial images were obtained from the base of the skull through the vertex without intravenous contrast. Sagittal and coronal MPR images  reconstructed from axial data set. COMPARISON:  None. FINDINGS: Brain: Generalized atrophy. Normal ventricular morphology. No midline shift or mass effect. Small vessel chronic ischemic changes of deep cerebral white matter. Question subcortical white matter infarct and RIGHT frontal lobe versus pronounced white matter disease. No intracranial hemorrhage, mass lesion or additional evidence of infarction. No extra-axial fluid collections. Vascular: Atherosclerotic calcifications of internal carotid arteries bilaterally at skull base Skull: Calvaria intact. Incomplete posterior arch C1, developmental variant. Sinuses/Orbits: Mucosal thickening LEFT maxillary sinus. Other: N/A IMPRESSION: Atrophy with small vessel chronic ischemic changes of deep cerebral white matter. 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 No additional intracranial abnormalities. Electronically Signed   By: Lavonia Dana M.D.   On: 10/25/2018 16:22   Mr Jodene Nam Head Wo Contrast  Result Date: 10/26/2018 CLINICAL DATA:  Stroke follow-up. Headache and visual loss for 5-7 days. EXAM: MRI HEAD WITHOUT CONTRAST MRA HEAD WITHOUT CONTRAST TECHNIQUE: Multiplanar, multiecho pulse sequences of the brain and surrounding structures were obtained without intravenous contrast. Angiographic images of the head were obtained using MRA technique without contrast. COMPARISON:  Head CT 10/25/2018 FINDINGS: MRI HEAD FINDINGS BRAIN: There is no acute infarct, acute hemorrhage or mass effect. The midline structures are normal. Multifocal white matter hyperintensity, most commonly due to chronic ischemic microangiopathy. Generalized atrophy without lobar predilection. Susceptibility-sensitive sequences show no chronic microhemorrhage or superficial siderosis. SKULL AND UPPER CERVICAL SPINE: The visualized skull base, calvarium, upper cervical spine and extracranial soft tissues are normal. SINUSES/ORBITS: No fluid levels or advanced mucosal thickening.  No mastoid or middle ear effusion. The orbits are normal. MRA HEAD FINDINGS POSTERIOR CIRCULATION: --Basilar artery: Normal. --Posterior cerebral arteries: Normal. Right PCA is partially supplied by the right P-comm. --Superior cerebellar arteries: Normal. --Inferior cerebellar arteries: Normal anterior and posterior inferior cerebellar arteries. ANTERIOR CIRCULATION: --Intracranial internal carotid arteries: Normal. --Anterior cerebral arteries: Normal. Both A1 segments are present. Patent anterior communicating artery. --Middle cerebral arteries: Normal. --Posterior communicating arteries: Present on the right, absent on the left. --Vertebral arteries: Left dominant.  Patent. IMPRESSION: 1. No acute intracranial abnormality. 2. Multifocal white matter disease most consistent with chronic ischemic microangiopathy. 3. Normal intracranial MRA. Electronically Signed   By: Ulyses Jarred M.D.   On: 10/26/2018 04:29   Mr Brain Wo Contrast  Result Date: 10/26/2018 CLINICAL DATA:  Stroke follow-up. Headache and visual loss for 5-7 days. EXAM: MRI HEAD WITHOUT CONTRAST MRA HEAD WITHOUT CONTRAST TECHNIQUE: Multiplanar, multiecho pulse sequences of the brain and surrounding structures were obtained without intravenous contrast. Angiographic images of the head were obtained using MRA technique without contrast. COMPARISON:  Head CT 10/25/2018 FINDINGS: MRI HEAD FINDINGS BRAIN: There is no acute infarct, acute hemorrhage or mass effect. The midline structures are normal. Multifocal white matter hyperintensity, most commonly due to  chronic ischemic microangiopathy. Generalized atrophy without lobar predilection. Susceptibility-sensitive sequences show no chronic microhemorrhage or superficial siderosis. SKULL AND UPPER CERVICAL SPINE: The visualized skull base, calvarium, upper cervical spine and extracranial soft tissues are normal. SINUSES/ORBITS: No fluid levels or advanced mucosal thickening. No mastoid or middle ear  effusion. The orbits are normal. MRA HEAD FINDINGS POSTERIOR CIRCULATION: --Basilar artery: Normal. --Posterior cerebral arteries: Normal. Right PCA is partially supplied by the right P-comm. --Superior cerebellar arteries: Normal. --Inferior cerebellar arteries: Normal anterior and posterior inferior cerebellar arteries. ANTERIOR CIRCULATION: --Intracranial internal carotid arteries: Normal. --Anterior cerebral arteries: Normal. Both A1 segments are present. Patent anterior communicating artery. --Middle cerebral arteries: Normal. --Posterior communicating arteries: Present on the right, absent on the left. --Vertebral arteries: Left dominant.  Patent. IMPRESSION: 1. No acute intracranial abnormality. 2. Multifocal white matter disease most consistent with chronic ischemic microangiopathy. 3. Normal intracranial MRA. Electronically Signed   By: Ulyses Jarred M.D.   On: 10/26/2018 04:29   US Abdomen Complete  Result Date: 10/26/2018 CLINICAL DATA:  63 year old male with elevated LFTs. Patient with hepatitis-C and diabetes. EXAM: ABDOMEN ULTRASOUND COMPLETE COMPARISON:  01/18/2015 ultrasound FINDINGS: Gallbladder: Mild gallbladder wall thickening is unchanged. There is no evidence of cholelithiasis, sonographic Murphy sign or pericholecystic fluid. Common bile duct: Diameter: 3 mm. No intrahepatic or extrahepatic biliary dilatation. Liver: Slightly heterogeneous and increased echogenicity of the hepatic echotexture noted. No focal hepatic abnormalities are identified. Portal vein is patent on color Doppler imaging with normal direction of blood flow towards the liver. IVC: No abnormality visualized. Pancreas: Visualized portion unremarkable. Spleen: Size and appearance within normal limits. Right Kidney: Length: 10.7 cm. Echogenicity within normal limits. No mass or hydronephrosis visualized. Left Kidney: Length: 10.1 cm. Echogenicity within normal limits. No mass or hydronephrosis visualized. Abdominal aorta: No  aneurysm visualized. Other findings: None. IMPRESSION: 1. Slightly heterogeneous and echogenic hepatic echotexture which may represent hepatic steatosis. 2. Mild gallbladder wall thickening again noted without cholelithiasis or other signs of acute cholecystitis, probably related to hepatic dysfunction. 3. No evidence of biliary dilatation. Electronically Signed   By: Margarette Canada M.D.   On: 10/26/2018 11:41   Vas US Carotid  Result Date: 10/26/2018 Carotid Arterial Duplex Study Indications: CVA. Performing Technologist: June Leap RDMS, RVT  Examination Guidelines: A complete evaluation includes B-mode imaging, spectral Doppler, color Doppler, and power Doppler as needed of all accessible portions of each vessel. Bilateral testing is considered an integral part of a complete examination. Limited examinations for reoccurring indications may be performed as noted.  Right Carotid Findings: +----------+--------+-------+--------+---------------------+-------------------+           PSV cm/sEDV    StenosisDescribe             Comments                              cm/s                                                    +----------+--------+-------+--------+---------------------+-------------------+ CCA Prox  85      7                                                       +----------+--------+-------+--------+---------------------+-------------------+  CCA Distal88      16                                                      +----------+--------+-------+--------+---------------------+-------------------+ ICA Prox  66      12     1-39%   smooth and           atypical appearance                                  homogeneous                              +----------+--------+-------+--------+---------------------+-------------------+ ICA Distal71      18                                                       +----------+--------+-------+--------+---------------------+-------------------+ ECA       130     19                                                      +----------+--------+-------+--------+---------------------+-------------------+ +----------+--------+-------+----------------+-------------------+           PSV cm/sEDV cmsDescribe        Arm Pressure (mmHG) +----------+--------+-------+----------------+-------------------+ [id number]             Multiphasic, WNL                    +----------+--------+-------+----------------+-------------------+ +---------+--------+--------+------+ VertebralPSV cm/sEDV cm/sAbsent +---------+--------+--------+------+  Left Carotid Findings: +----------+--------+--------+--------+------------+--------+           PSV cm/sEDV cm/sStenosisDescribe    Comments +----------+--------+--------+--------+------------+--------+ CCA Prox  99      15                                   +----------+--------+--------+--------+------------+--------+ CCA Distal65      18                                   +----------+--------+--------+--------+------------+--------+ ICA Prox  24      7       1-39%   heterogenous         +----------+--------+--------+--------+------------+--------+ ICA Distal57      15                                   +----------+--------+--------+--------+------------+--------+ ECA       147     21                                   +----------+--------+--------+--------+------------+--------+ +----------+--------+--------+----------------+-------------------+ SubclavianPSV cm/sEDV cm/sDescribe        Arm Pressure (mmHG) +----------+--------+--------+----------------+-------------------+  188             Multiphasic, WNL                    +----------+--------+--------+----------------+-------------------+ +---------+--------+--+--------+--+---------+ VertebralPSV cm/s48EDV  cm/s12Antegrade +---------+--------+--+--------+--+---------+  Summary: Right Carotid: Velocities in the right ICA are consistent with a 1-39% stenosis. Left Carotid: Velocities in the left ICA are consistent with a 1-39% stenosis. Vertebrals:  Left vertebral artery demonstrates antegrade flow. Right vertebral              artery was not visualized. Subclavians: Normal flow hemodynamics were seen in bilateral subclavian              arteries. *See table(s) above for measurements and observations.  Electronically signed by Antony Contras MD on 10/26/2018 at 4:09:18 PM.    Final     Scheduled Meds: .  stroke: mapping our early stages of recovery book   Does not apply Once  . amLODipine  10 mg Oral Daily  . aspirin EC  81 mg Oral Daily  . canagliflozin  100 mg Oral QAC breakfast  . enoxaparin (LOVENOX) injection  40 mg Subcutaneous Q24H  . gabapentin  300 mg Oral Daily  . insulin aspart  0-9 Units Subcutaneous Q4H  . insulin glargine  10 Units Subcutaneous Daily  . lisinopril  40 mg Oral Daily  . potassium chloride  40 mEq Oral BID   Continuous Infusions:    LOS: 1 day   Time spent: 35 minutes.  Patrecia Pour, MD [facility] [email or web address] Password [email or web address] 10/27/2018, 12:12 PM

## 2018-10-28 DIAGNOSIS — R739 Hyperglycemia, unspecified: Secondary | ICD-10-CM

## 2018-10-28 DIAGNOSIS — R945 Abnormal results of liver function studies: Secondary | ICD-10-CM

## 2018-10-28 LAB — BASIC METABOLIC PANEL
Anion gap: 7 (ref 5–15)
BUN: 6 mg/dL — ABNORMAL LOW (ref 8–23)
CHLORIDE: 107 mmol/L (ref 98–111)
CO2: 25 mmol/L (ref 22–32)
Calcium: 8 mg/dL — ABNORMAL LOW (ref 8.9–10.3)
Creatinine, Ser: 0.77 mg/dL (ref 0.61–1.24)
GFR calc Af Amer: >60 mL/min (ref >60)
GFR calc non Af Amer: >60 mL/min (ref >60)
Glucose, Bld: 167 mg/dL — ABNORMAL HIGH (ref 70–99)
Potassium: 3.6 mmol/L (ref 3.5–5.1)
SODIUM: 139 mmol/L (ref 135–145)

## 2018-10-28 LAB — GLUCOSE, CAPILLARY
GLUCOSE-CAPILLARY: 138 mg/dL — AB (ref 70–99)
Glucose-Capillary: 279 mg/dL — ABNORMAL HIGH (ref 70–99)
Glucose-Capillary: 311 mg/dL — ABNORMAL HIGH (ref 70–99)
Glucose-Capillary: 161 mg/dL — ABNORMAL HIGH (ref 70–99)

## 2018-10-28 LAB — HEMOGLOBIN A1C
Hgb A1c MFr Bld: >15.5 % — ABNORMAL HIGH (ref 4.8–5.6)
Mean Plasma Glucose: >398 mg/dL

## 2018-10-28 LAB — MAGNESIUM: Magnesium: 2 mg/dL (ref 1.7–2.4)

## 2018-10-28 MED ORDER — INSULIN PEN NEEDLE 31G X 6 MM MISC: 1.0000 | Freq: Two times a day (BID) | 3 refills | Status: AC

## 2018-10-28 MED ORDER — METFORMIN HCL 500 MG PO TABS
500.0000 mg | ORAL_TABLET | Freq: Two times a day (BID) | ORAL | Status: DC
  Administered 2018-10-28: 500 mg via ORAL
  Filled 2018-10-28: qty 1

## 2018-10-28 MED ORDER — METFORMIN HCL 500 MG PO TABS: 500.0000 mg | ORAL_TABLET | Freq: Two times a day (BID) | ORAL | 0 refills | Status: AC

## 2018-10-28 MED ORDER — LISINOPRIL 40 MG PO TABS: 40.0000 mg | ORAL_TABLET | Freq: Every day | ORAL | 0 refills | Status: AC

## 2018-10-28 MED ORDER — CANAGLIFLOZIN 100 MG PO TABS: 100.0000 mg | ORAL_TABLET | Freq: Every day | ORAL | 0 refills | Status: AC

## 2018-10-28 MED ORDER — INSULIN DETEMIR 100 UNIT/ML FLEXPEN: 15.0000 [IU] | PEN_INJECTOR | Freq: Every day | SUBCUTANEOUS | 11 refills | Status: AC

## 2018-10-28 MED ORDER — BLOOD GLUCOSE MONITOR KIT: PACK | 0 refills | Status: AC

## 2018-10-28 MED ORDER — INSULIN ASPART 100 UNIT/ML ~~LOC~~ SOLN
3.0000 [IU] | Freq: Three times a day (TID) | SUBCUTANEOUS | Status: DC
  Administered 2018-10-28 (×2): 3 [IU] via SUBCUTANEOUS

## 2018-10-28 MED ORDER — INSULIN ASPART 100 UNIT/ML ~~LOC~~ SOLN
0.0000 [IU] | Freq: Three times a day (TID) | SUBCUTANEOUS | Status: DC
  Administered 2018-10-28: 7 [IU] via SUBCUTANEOUS
  Administered 2018-10-28: 2 [IU] via SUBCUTANEOUS

## 2018-10-28 MED ORDER — POTASSIUM CHLORIDE CRYS ER 20 MEQ PO TBCR
40.0000 meq | EXTENDED_RELEASE_TABLET | Freq: Two times a day (BID) | ORAL | Status: DC
  Administered 2018-10-28: 40 meq via ORAL
  Filled 2018-10-28: qty 2

## 2018-10-28 MED ORDER — INSULIN GLARGINE 100 UNIT/ML ~~LOC~~ SOLN
15.0000 [IU] | Freq: Every day | SUBCUTANEOUS | Status: DC
  Administered 2018-10-28: 15 [IU] via SUBCUTANEOUS
  Filled 2018-10-28: qty 0.15

## 2018-10-28 NOTE — Care Management Note (Signed)
Case Management Note  Patient Details  Name: Xavier Kennedy MRN: [id number] Date of Birth: August 14, 1956  Subjective/Objective:                    Action/Plan: Pt discharging home with [facility] services. CM provided him choice and [facility] selected. Cory with [facility] notified and accepted the referral.  Pt with orders for 3 in 1. DME delivered to the room. Sister concerned about pt at home and requesting aide services. CM informed her that aides would have to be approved through his medicaid. CM started the process by faxing the forms to [employer]. CM also informed the sister that [facility] doesn't provide aides and that care would mainly fall upon the family. Family to provide transportation home.  Expected Discharge Date:  10/28/18               Expected Discharge Plan:  IP Rehab Facility  In-House Referral:     Discharge planning Services  CM Consult  Post Acute Care Choice:  Durable Medical Equipment, Home Health Choice offered to:  Patient  DME Arranged:  3-N-1 DME Agency:     HH Arranged:  RN, PT, OT, Social Work HH Agency:  [facility]  Status of Service:  Completed, signed off  If discussed at [employer] of Stay Meetings, dates discussed:    Additional Comments:  Kermit Balo, RN 10/28/2018, 12:11 PM

## 2018-10-28 NOTE — Plan of Care (Signed)

## 2018-10-28 NOTE — Care Management Important Message (Signed)
Important Message  Patient Details  Name: Xavier Kennedy MRN: [id number] Date of Birth: Aug 30, 1956   Medicare Important Message Given:  Yes    Dorena Bodo 10/28/2018, 4:04 PM

## 2018-10-28 NOTE — Progress Notes (Signed)
Physical Therapy Treatment Patient Details Name: Xavier Kennedy MRN: [id number] DOB: 14-Aug-1956 Today's Date: 10/28/2018    History of Present Illness 63 y.o. male with history of hypertension, diabetes mellitus, s/p back sx, s/p hip replacements, and hepatitis C presents to the ER with complaints of visual loss mostly on the right side visual field for the last 1 week.  Also has benign left temporal headache at the same time. MRI showing no acute abnormalities. Patient reports several falls leading up to his admission into the hospital.    PT Comments    Pt performed gait training and functional mobility with progression to stair training, Utilized [facility] for gait training and patient using R UE for furniture grabbing and utilizing rail in hall.  Pt has a RW at home and would benefit from continued use until balance improves.  Pt recognizes this and is agreeable to utilize RW at home at d/c.  Based on rehab denial and progression of mobility will inform supervising PT of progress and change in recommendations to HHPT.  Pt will require a 3:1 commode at home and nurse CM informed.    Follow Up Recommendations  Supervision for mobility/OOB;Home health PT     Equipment Recommendations  3in1 (PT)    Recommendations for Other Services       Precautions / Restrictions Precautions Precautions: Fall Precaution Comments: Right side vision loss Restrictions Weight Bearing Restrictions: No    Mobility  Bed Mobility               General bed mobility comments: Pt seated edge of bed on arrival.    Transfers Overall transfer level: Needs assistance Equipment used: Straight cane Transfers: Sit to/from Stand Sit to Stand: Supervision         General transfer comment: Cues for hand placement, close guard for safety but good technique and no LOB.    Ambulation/Gait Ambulation/Gait assistance: Min guard Gait Distance (Feet): 140 Feet Assistive device: Straight cane Gait  Pattern/deviations: Step-through pattern;Drifts right/left;Trunk flexed Gait velocity: decreased   General Gait Details: Pt reaching for objects and railings in hall with R hand.  SPC in L hand.  Pt educated to use RW at home and work with progression to cane with PT at home.  He remains unsteady with use of SPC and use of RW would remedy this issue.     Stairs Stairs: Yes Stairs assistance: Supervision Stair Management: Two rails;Step to pattern;Forwards Number of Stairs: 6 General stair comments: Cues for sequencing.     Wheelchair Mobility    Modified Rankin (Stroke Patients Only) Modified Rankin (Stroke Patients Only) Pre-Morbid Rankin Score: No significant disability Modified Rankin: Moderately severe disability     Balance Overall balance assessment: Needs assistance Sitting-balance support: No upper extremity supported;Feet supported Sitting balance-Leahy Scale: Fair       Standing balance-Leahy Scale: Fair                              Cognition Arousal/Alertness: Awake/alert Behavior During Therapy: WFL for tasks assessed/performed Overall Cognitive Status: Within Functional Limits for tasks assessed                                 General Comments: Pt demonstrates good safety awareness and anticipatory awareness of deficits       Exercises      General Comments  Pertinent Vitals/Pain Pain Assessment: No/denies pain Pain Score: 7  Faces Pain Scale: Hurts little more Pain Location: headache  Pain Descriptors / Indicators: Constant;Aching;Throbbing Pain Intervention(s): Monitored during session;Repositioned    Home Living                      Prior Function            PT Goals (current goals can now be found in the care plan section) Acute Rehab PT Goals Patient Stated Goal: "Go home" Potential to Achieve Goals: Good Progress towards PT goals: Progressing toward goals    Frequency    Min  3X/week      PT Plan Discharge plan needs to be updated    Co-evaluation              AM-PAC PT "6 Clicks" Mobility   Outcome Measure  Help needed turning from your back to your side while in a flat bed without using bedrails?: A Little Help needed moving from lying on your back to sitting on the side of a flat bed without using bedrails?: A Little Help needed moving to and from a bed to a chair (including a wheelchair)?: A Little Help needed standing up from a chair using your arms (e.g., wheelchair or bedside chair)?: A Little Help needed to walk in hospital room?: A Little Help needed climbing 3-5 steps with a railing? : A Little 6 Click Score: 18    End of Session Equipment Utilized During Treatment: Gait belt Activity Tolerance: Patient tolerated treatment well Patient left: in chair;with call bell/phone within reach Nurse Communication: Mobility status PT Visit Diagnosis: Other abnormalities of gait and mobility (R26.89);Other symptoms and signs involving the nervous system (R29.898);Unsteadiness on feet (R26.81);History of falling (Z91.81)     Time: 6967-8938 PT Time Calculation (min) (ACUTE ONLY): 17 min  Charges:  $Gait Training: 8-22 mins                     Joycelyn Rua, PTA Acute Rehabilitation Services Pager [phone] Office [phone]     Xavier Kennedy 10/28/2018, 2:10 PM

## 2018-10-28 NOTE — Discharge Summary (Addendum)
Physician Discharge Summary  Xavier Kennedy GGY:[id number] DOB: 03-Sep-1956 DOA: 10/25/2018  PCP: Beckie Salts, MD  Admit date: 10/25/2018 Discharge date: 10/28/2018  Admitted From: Home Disposition:  Home  Recommendations for Outpatient Follow-up:  1. Follow up with PCP in 1-2 weeks 2. Titrate insulins as needed.  Home Health:Yes Equipment/Devices:none  Discharge Condition:Stable CODE STATUS:Full Diet recommendation: Heart Healthy   Brief/Interim Summary: 63 y.o. male past medical history of diabetes mellitus type 2, hepatitis C non-compliance with medication presents Xavier Kennedy for left-sided headache, decreased visual fields on the right and inability to discern colors and a blood glucose of 600 CT of the head showed chronic small vessel ischemic changes, was admitted to [facility] for stroke work-up which turned out to be negative, ophthalmology was consulted and felt there was no intraocular explanation for her symptoms.  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 He was placed on Lantus 15 units metformin twice a day and Invokana. I spoke to him at length about eating regularly and not missing meals and not having large loads of carbohydrates consumed during a single meal.  How he should have small frequent meals. We will follow-up with his primary care doctor an outpatient and further titrate medications as needed.  Abnormal vision loss: Stroke work-up was negative.   Continue low-dose aspirin. Ophthalmology evaluated the patient,  recommended automated visual field testing as an outpatient Abnormal vision possibly due to elevated chronic uncontrolled blood glucose.  Hypokalemia: Repleted orally now improved.   Essential hypertension Blood pressure is at goal with current dose of amlodipine and lisinopril.    Chronic hepatitis C virus infection ([place]) Follow-up with PCP as an outpatient.   Discharge Instructions  Discharge Instructions    Diet - low sodium heart healthy   Complete by:  As directed    Increase activity slowly   Complete by:  As directed      Allergies as of 10/28/2018      Reactions   Gabapentin Nausea Only   Other reaction(s): Other (See Comments) Nausea    Ivp Dye [iodinated Diagnostic Agents]    Morphine Nausea Only   Oxycodone Nausea Only   Weight loss   Trazodone And Nefazodone Other (See Comments)   Pt reports vision changes      Medication List    TAKE these medications   amLODipine 10 MG tablet Commonly known as:  NORVASC Take 10 mg by mouth daily.   aspirin 81 MG tablet Take 81 mg by mouth daily.   blood glucose meter kit and supplies Kit Dispense based on patient and insurance preference. Use up to four times daily as directed. (FOR ICD-9 250.00, 250.01).   canagliflozin 100 MG Tabs tablet Commonly known as:  INVOKANA Take 1 tablet (100 mg total) by mouth daily before breakfast.   DULoxetine 30 MG capsule Commonly known as:  CYMBALTA Take 30 mg by mouth at bedtime as needed (mood).   Insulin Detemir 100 UNIT/ML Pen Commonly known as:  LEVEMIR Inject 15 Units into the skin daily.   Insulin Pen Needle 31G X 6 MM Misc 1  Device by Does not apply route 2 (two) times daily.   lisinopril 40 MG tablet Commonly known as:  PRINIVIL,ZESTRIL Take 1 tablet (40 mg total) by mouth daily. What changed:    medication strength  how much to take   metFORMIN 500 MG tablet Commonly known as:  GLUCOPHAGE Take 1 tablet (500 mg total) by mouth 2 (two) times daily with  a meal. What changed:    when to take this  reasons to take this   NUCYNTA 75 MG tablet Generic drug:  tapentadol HCl Take 75 mg by mouth every 12 (twelve) hours as needed for moderate pain.   potassium chloride 10 MEQ tablet Commonly known as:  K-DUR Take 10 mEq by mouth daily as needed (low potassium).            Durable Medical Equipment  (From admission, onward)         Start     Ordered   10/28/18 1212  For home use only DME 3 n 1  Once     10/28/18 1211         Follow-up Information    [facility] Follow up.   Specialty:  Home Health Services Why:  They will contact you for the first appointment Contact information: [address] [place] [address] [phone]          Allergies  Allergen Reactions  . Gabapentin Nausea Only    Other reaction(s): Other (See Comments) Nausea   . Ivp Dye [Iodinated Diagnostic Agents]   . Morphine Nausea Only  . Oxycodone Nausea Only    Weight loss  . Trazodone And Nefazodone Other (See Comments)    Pt reports vision changes    Consultations:  Neurology  Ophthalmology   Procedures/Studies: Ct Head Wo Contrast  Result Date: 10/25/2018 CLINICAL DATA:  Severe headache, lost vision 1 week ago, cough, nausea, vomiting, history diabetes mellitus, hypertension EXAM: CT HEAD WITHOUT CONTRAST TECHNIQUE: Contiguous axial images were obtained from the base of the skull through the vertex without intravenous contrast. Sagittal and coronal MPR images reconstructed from axial data set. COMPARISON:  None. FINDINGS: Brain: Generalized atrophy. Normal ventricular morphology. No midline shift or mass effect. Small vessel chronic ischemic changes of deep cerebral white matter. Question subcortical white matter infarct and RIGHT frontal lobe versus pronounced white matter disease. No intracranial hemorrhage, mass lesion or additional evidence of infarction. No extra-axial fluid collections. Vascular:  Atherosclerotic calcifications of internal carotid arteries bilaterally at skull base Skull: Calvaria intact. Incomplete posterior arch C1, developmental variant. Sinuses/Orbits: Mucosal thickening LEFT maxillary sinus. Other: N/A IMPRESSION: Atrophy with small vessel chronic ischemic changes of deep cerebral white matter. 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 No additional intracranial abnormalities. Electronically Signed   By: Lavonia Dana M.D.   On: 10/25/2018 16:22   Mr Jodene Nam Head Wo Contrast  Result Date: 10/26/2018 CLINICAL DATA:  Stroke follow-up. Headache and visual loss for 5-7 days. EXAM: MRI HEAD WITHOUT CONTRAST MRA HEAD WITHOUT CONTRAST TECHNIQUE: Multiplanar, multiecho pulse sequences of the brain and surrounding structures were obtained without intravenous contrast. Angiographic images of the head were obtained using MRA technique without contrast. COMPARISON:  Head CT 10/25/2018 FINDINGS: MRI HEAD FINDINGS BRAIN: There is no acute infarct, acute hemorrhage or mass effect. The midline structures are normal. Multifocal white matter hyperintensity, most commonly due to chronic ischemic microangiopathy. Generalized atrophy without lobar predilection. Susceptibility-sensitive sequences show no chronic microhemorrhage or superficial siderosis. SKULL AND UPPER  CERVICAL SPINE: The visualized skull base, calvarium, upper cervical spine and extracranial soft tissues are normal. SINUSES/ORBITS: No fluid levels or advanced mucosal thickening. No mastoid or middle ear effusion. The orbits are normal. MRA HEAD FINDINGS POSTERIOR CIRCULATION: --Basilar artery: Normal. --Posterior cerebral arteries: Normal. Right PCA is partially supplied by the right P-comm. --Superior cerebellar arteries: Normal. --Inferior cerebellar arteries: Normal anterior and posterior inferior cerebellar arteries. ANTERIOR CIRCULATION: --Intracranial internal carotid arteries: Normal. --Anterior cerebral arteries:  Normal. Both A1 segments are present. Patent anterior communicating artery. --Middle cerebral arteries: Normal. --Posterior communicating arteries: Present on the right, absent on the left. --Vertebral arteries: Left dominant.  Patent. IMPRESSION: 1. No acute intracranial abnormality. 2. Multifocal white matter disease most consistent with chronic ischemic microangiopathy. 3. Normal intracranial MRA. Electronically Signed   By: Ulyses Jarred M.D.   On: 10/26/2018 04:29   Mr Brain Wo Contrast  Result Date: 10/26/2018 CLINICAL DATA:  Stroke follow-up. Headache and visual loss for 5-7 days. EXAM: MRI HEAD WITHOUT CONTRAST MRA HEAD WITHOUT CONTRAST TECHNIQUE: Multiplanar, multiecho pulse sequences of the brain and surrounding structures were obtained without intravenous contrast. Angiographic images of the head were obtained using MRA technique without contrast. COMPARISON:  Head CT 10/25/2018 FINDINGS: MRI HEAD FINDINGS BRAIN: There is no acute infarct, acute hemorrhage or mass effect. The midline structures are normal. Multifocal white matter hyperintensity, most commonly due to chronic ischemic microangiopathy. Generalized atrophy without lobar predilection. Susceptibility-sensitive sequences show no chronic microhemorrhage or superficial siderosis. SKULL AND UPPER CERVICAL SPINE: The visualized skull base, calvarium, upper cervical spine and extracranial soft tissues are normal. SINUSES/ORBITS: No fluid levels or advanced mucosal thickening. No mastoid or middle ear effusion. The orbits are normal. MRA HEAD FINDINGS POSTERIOR CIRCULATION: --Basilar artery: Normal. --Posterior cerebral arteries: Normal. Right PCA is partially supplied by the right P-comm. --Superior cerebellar arteries: Normal. --Inferior cerebellar arteries: Normal anterior and posterior inferior cerebellar arteries. ANTERIOR CIRCULATION: --Intracranial internal carotid arteries: Normal. --Anterior cerebral arteries: Normal. Both A1 segments are  present. Patent anterior communicating artery. --Middle cerebral arteries: Normal. --Posterior communicating arteries: Present on the right, absent on the left. --Vertebral arteries: Left dominant.  Patent. IMPRESSION: 1. No acute intracranial abnormality. 2. Multifocal white matter disease most consistent with chronic ischemic microangiopathy. 3. Normal intracranial MRA. Electronically Signed   By: Ulyses Jarred M.D.   On: 10/26/2018 04:29   US Abdomen Complete  Result Date: 10/26/2018 CLINICAL DATA:  63 year old male with elevated LFTs. Patient with hepatitis-C and diabetes. EXAM: ABDOMEN ULTRASOUND COMPLETE COMPARISON:  01/18/2015 ultrasound FINDINGS: Gallbladder: Mild gallbladder wall thickening is unchanged. There is no evidence of cholelithiasis, sonographic Murphy sign or pericholecystic fluid. Common bile duct: Diameter: 3 mm. No intrahepatic or extrahepatic biliary dilatation. Liver: Slightly heterogeneous and increased echogenicity of the hepatic echotexture noted. No focal hepatic abnormalities are identified. Portal vein is patent on color Doppler imaging with normal direction of blood flow towards the liver. IVC: No abnormality visualized. Pancreas: Visualized portion unremarkable. Spleen: Size and appearance within normal limits. Right Kidney: Length: 10.7 cm. Echogenicity within normal limits. No mass or hydronephrosis visualized. Left Kidney: Length: 10.1 cm. Echogenicity within normal limits. No mass or hydronephrosis visualized. Abdominal aorta: No aneurysm visualized. Other findings: None. IMPRESSION: 1. Slightly heterogeneous and echogenic hepatic echotexture which may represent hepatic steatosis. 2. Mild gallbladder wall thickening again noted without cholelithiasis or other signs of acute cholecystitis, probably related to hepatic dysfunction. 3. No evidence of biliary dilatation. Electronically Signed   By: Cleatis Polka.D.  On: 10/26/2018 11:41   Vas US Carotid  Result Date:  10/26/2018 Carotid Arterial Duplex Study Indications: CVA. Performing Technologist: June Leap RDMS, RVT  Examination Guidelines: A complete evaluation includes B-mode imaging, spectral Doppler, color Doppler, and power Doppler as needed of all accessible portions of each vessel. Bilateral testing is considered an integral part of a complete examination. Limited examinations for reoccurring indications may be performed as noted.  Right Carotid Findings: +----------+--------+-------+--------+---------------------+-------------------+           PSV cm/sEDV    StenosisDescribe             Comments                              cm/s                                                    +----------+--------+-------+--------+---------------------+-------------------+ CCA Prox  85      7                                                       +----------+--------+-------+--------+---------------------+-------------------+ CCA Distal88      16                                                      +----------+--------+-------+--------+---------------------+-------------------+ ICA Prox  66      12     1-39%   smooth and           atypical appearance                                  homogeneous                              +----------+--------+-------+--------+---------------------+-------------------+ ICA Distal71      18                                                      +----------+--------+-------+--------+---------------------+-------------------+ ECA       130     19                                                      +----------+--------+-------+--------+---------------------+-------------------+ +----------+--------+-------+----------------+-------------------+           PSV cm/sEDV cmsDescribe        Arm Pressure (mmHG) +----------+--------+-------+----------------+-------------------+ [id number]             Multiphasic, WNL                     +----------+--------+-------+----------------+-------------------+ +---------+--------+--------+------+  VertebralPSV cm/sEDV cm/sAbsent +---------+--------+--------+------+  Left Carotid Findings: +----------+--------+--------+--------+------------+--------+           PSV cm/sEDV cm/sStenosisDescribe    Comments +----------+--------+--------+--------+------------+--------+ CCA Prox  99      15                                   +----------+--------+--------+--------+------------+--------+ CCA Distal65      18                                   +----------+--------+--------+--------+------------+--------+ ICA Prox  24      7       1-39%   heterogenous         +----------+--------+--------+--------+------------+--------+ ICA Distal57      15                                   +----------+--------+--------+--------+------------+--------+ ECA       147     21                                   +----------+--------+--------+--------+------------+--------+ +----------+--------+--------+----------------+-------------------+ SubclavianPSV cm/sEDV cm/sDescribe        Arm Pressure (mmHG) +----------+--------+--------+----------------+-------------------+           188             Multiphasic, WNL                    +----------+--------+--------+----------------+-------------------+ +---------+--------+--+--------+--+---------+ VertebralPSV cm/s48EDV cm/s12Antegrade +---------+--------+--+--------+--+---------+  Summary: Right Carotid: Velocities in the right ICA are consistent with a 1-39% stenosis. Left Carotid: Velocities in the left ICA are consistent with a 1-39% stenosis. Vertebrals:  Left vertebral artery demonstrates antegrade flow. Right vertebral              artery was not visualized. Subclavians: Normal flow hemodynamics were seen in bilateral subclavian              arteries. *See table(s) above for measurements and observations.  Electronically signed  by Antony Contras MD on 10/26/2018 at 4:09:18 PM.    Final    (Echo, Carotid, EGD, Colonoscopy, ERCP)    Subjective: No complains  Discharge Exam: Vitals:   10/28/18 0016 10/28/18 0427  BP: (!) 123/56 128/90  Pulse: 85 90  Resp: 18 20  Temp: 98.3 F (36.8 C) 98.2 F (36.8 C)  SpO2: 100% 99%     General: Pt is alert, awake, not in acute distress Cardiovascular: RRR, S1/S2 +, no rubs, no gallops Respiratory: CTA bilaterally, no wheezing, no rhonchi Abdominal: Soft, NT, ND, bowel sounds + Extremities: no edema, no cyanosis    The results of significant diagnostics from this hospitalization (including imaging, microbiology, ancillary and laboratory) are listed below for reference.     Microbiology: No results found for this or any previous visit (from the past 240 hour(s)).   Labs: BNP (last 3 results) No results for input(s): BNP in the last 8760 hours. Basic Metabolic Panel: Recent Labs  Lab 10/26/18 0007 10/26/18 0552 10/26/18 1841 10/27/18 0544 10/28/18 0630  NA 141 145 137 144 139  K 2.9* 2.8* 3.8 2.8* 3.6  CL 105 105 105 107 107  CO2 26 33* 20* 24 25  GLUCOSE 402* 138*  664* 135* 167*  BUN 5* <5* 7* 5* 6*  CREATININE 0.85 0.83 1.03 0.71 0.77  CALCIUM 7.9* 8.1* 7.9* 8.0* 8.0*  MG  --   --  1.7 1.7 2.0   Liver Function Tests: Recent Labs  Lab 10/25/18 1625 10/26/18 0552  AST 147* 143*  ALT 151* 134*  ALKPHOS 157* 125  BILITOT 1.2 1.0  PROT 7.8 7.2  ALBUMIN 3.6 3.1*   Recent Labs  Lab 10/26/18 0552  LIPASE 45   No results for input(s): AMMONIA in the last 168 hours. CBC: Recent Labs  Lab 10/25/18 1625 10/26/18 0007 10/27/18 0544  WBC 6.3 5.7 5.8  NEUTROABS 4.4  --   --   HGB 16.6 14.6 15.2  HCT 49.7 43.1 45.6  MCV 89.9 87.8 89.1  PLT 150 130* 145*   Cardiac Enzymes: Recent Labs  Lab 10/25/18 1625  TROPONINI <0.03   BNP: Invalid input(s): POCBNP CBG: Recent Labs  Lab 10/27/18 2344 10/28/18 0013 10/28/18 0324 10/28/18 0922  10/28/18 1132  GLUCAP 53* 138* 279* 311* 161*   D-Dimer No results for input(s): DDIMER in the last 72 hours. Hgb A1c Recent Labs    10/26/18 0007 10/27/18 0544  HGBA1C >15.5* >15.5*   Lipid Profile Recent Labs    10/26/18 0007  CHOL 75  HDL 26*  LDLCALC 35  TRIG 72  CHOLHDL 2.9   Thyroid function studies No results for input(s): TSH, T4TOTAL, T3FREE, THYROIDAB in the last 72 hours.  Invalid input(s): FREET3 Anemia work up No results for input(s): VITAMINB12, FOLATE, FERRITIN, TIBC, IRON, RETICCTPCT in the last 72 hours. Urinalysis    Component Value Date/Time   Benjamin Stain NEGATIVE 10/26/2018 2009   Sepsis Labs Invalid input(s): PROCALCITONIN,  WBC,  LACTICIDVEN Microbiology No results found for this or any previous visit (from the past 240 hour(s)).   Time coordinating discharge: 40 minutes  SIGNED:   Charlynne Cousins, MD  [facility]

## 2018-10-28 NOTE — Progress Notes (Signed)
Occupational Therapy Treatment Patient Details Name: Xavier Kennedy MRN: [id number] DOB: [birth year] Today's Date: 10/28/2018    History of present illness 63 y.o. male with history of hypertension, diabetes mellitus, s/p back sx, s/p hip replacements, and hepatitis C presents to the ER with complaints of visual loss mostly on the right side visual field for the last 1 week.  Also has benign left temporal headache at the same time. MRI showing no acute abnormalities. Patient reports several falls leading up to his admission into the hospital.   OT comments  Pt reports he feels vision is improved compared to yesterday, but not at baseline.  He is able to verbalize all precautions and safety issues, related to visual deficits, that were discussed with him yesterday.   Visual field deficit handout provided to pt and he was instructed in use of "anchors" to mark Rt margin and improve visual scanning.  Recommend no driving, follow up with ophthalmologist for visual field testing, and supervision with medication management, financial management, cooking, and community activities - he verbalizes understanding   Follow Up Recommendations  Home health OT;Supervision/Assistance - 24 hour    Equipment Recommendations  None recommended by OT    Recommendations for Other Services      Precautions / Restrictions Precautions Precautions: Fall Precaution Comments: Right side vision loss       Mobility Bed Mobility                  Transfers                      Balance                                           ADL either performed or assessed with clinical judgement   ADL                                               Vision   Visual Fields: Right visual field deficit Additional Comments: Pt reports vision improved today, and headache is better.  Pt is now able to detect movement in Rt visual field, but inconsistent with finger counting.   He is able to verbalize all precautions we discussed yesterday and reports he will have someone with him at all times when out in community, and will have assist with finances, and medications.  Visual field deficit handout provided and reviewed.  Discussed use of anchors to help train scanning, and anchors were provided.   He is aware of need to see ophthalmologist at discharge.    Perception     Praxis      Cognition Arousal/Alertness: Awake/alert Behavior During Therapy: WFL for tasks assessed/performed Overall Cognitive Status: Within Functional Limits for tasks assessed                                          Exercises     Shoulder Instructions       General Comments      Pertinent Vitals/ Pain       Pain Assessment: No/denies pain  Home Living  Prior Functioning/Environment              Frequency  Min 3X/week        Progress Toward Goals  OT Goals(current goals can now be found in the care plan section)  Progress towards OT goals: Progressing toward goals     Plan Discharge plan remains appropriate    Co-evaluation                 AM-PAC OT "6 Clicks" Daily Activity     Outcome Measure   Help from another person eating meals?: None Help from another person taking care of personal grooming?: A Little Help from another person toileting, which includes using toliet, bedpan, or urinal?: A Little Help from another person bathing (including washing, rinsing, drying)?: A Little Help from another person to put on and taking off regular upper body clothing?: A Little Help from another person to put on and taking off regular lower body clothing?: A Little 6 Click Score: 19    End of Session    OT Visit Diagnosis: Low vision, both eyes (H54.2);Muscle weakness (generalized) (M62.81);Unsteadiness on feet (R26.81);Pain   Activity Tolerance Patient tolerated treatment well    Patient Left in bed;with call bell/phone within reach   Nurse Communication          Time: 8032-1224 OT Time Calculation (min): 8 min  Charges: OT General Charges $OT Visit: 1 Visit OT Treatments $Self Care/Home Management : 8-22 mins  Jeani Hawking, OTR/L Acute Rehabilitation Services Pager [phone] Office [phone]   Jeani Hawking M 10/28/2018, 11:38 AM

## 2018-10-28 NOTE — Progress Notes (Signed)
Rechecked patient's blood sugar and it's 138 mg/dl

## 2018-10-28 NOTE — Progress Notes (Signed)
Inpatient Diabetes Program Recommendations  AACE/ADA: New Consensus Statement on Inpatient Glycemic Control (2015)  Target Ranges:  Prepandial:   less than 140 mg/dL      Peak postprandial:   less than 180 mg/dL (1-2 hours)      Critically ill patients:  140 - 180 mg/dL   Lab Results  Component Value Date   GLUCAP 161 (H) 10/28/2018   HGBA1C >15.5 (H) 10/27/2018    Review of Glycemic Control Results for Xavier Kennedy, Xavier Kennedy (MRN [id number]) as of 10/28/2018 13:34  Ref. Range 10/27/2018 23:44 10/28/2018 00:13 10/28/2018 03:24 10/28/2018 09:22 10/28/2018 11:32  Glucose-Capillary Latest Ref Range: 70 - 99 mg/dL 53 (L) 138 (H) 279 (H) 311 (H) 161 (H)   Diabetes history: DM  Outpatient Diabetes medications:  Metformin 500 mg bid Current orders for Inpatient glycemic control:  Novolog sensitive tid with meals, Novolog 3 units tid with meals, Lantus 15 units daily Inpatient Diabetes Program Recommendations:    Note plans for patient to d/c home.  Spoke with patient regarding diabetes and d/c plans.  We discussed normal blood sugars levels, signs of high and low blood sugars and the importance of f/u with PCP.  Patient had a low blood sugar last night and states he felt "bad".  We discussed proper treatment of low blood sugar using 4 oz of juice or regular soda.  Patient was able to teach back,  Encouraged him to f/u with PCP next week.  He will only be on one shot daily for now and will have home health RN as well.  We reviewed insulin starter kit handout and patient was able to verbally review use of insulin pen.  He states that his vision is feeling better today.  He did demonstrate insulin pen use on 2/13.  Offered for him to practice again and he states "I've got it".  States that his sister will be helping him.   Thanks,  Adah Perl, RN, BC-ADM Inpatient Diabetes Coordinator Pager [phone] (8a-5p)

## 2018-10-28 NOTE — Progress Notes (Signed)
Patient for discharge today with sister.  Alert and oriented,  Vital sign stable.  avs discussed and prescription given.  No complaint of discomfort upon discharge.

## 2018-12-06 ENCOUNTER — Other Ambulatory Visit: Payer: Self-pay

## 2018-12-06 ENCOUNTER — Encounter: Payer: Self-pay | Admitting: Registered"

## 2018-12-06 ENCOUNTER — Encounter: Payer: Medicare Other | Attending: Internal Medicine | Admitting: Registered"

## 2018-12-06 DIAGNOSIS — E1165 Type 2 diabetes mellitus with hyperglycemia: Secondary | ICD-10-CM | POA: Insufficient documentation

## 2018-12-06 NOTE — Patient Instructions (Signed)
Consider eating balanced meals and snacks You can check your blood sugar about 2 hours after a meal if you want to know if that meal worked to keep your blood sugar in range. Consider taking your medications as directed. Check your metformin, it appears your prescription calls for 2 pills am and 2 pills pm. When eating fruit be sure to include protein. American Heart Association recommendation to eat fish 2 or 3 times per week. Continue with your plan keep up with other medical care appointments.

## 2018-12-06 NOTE — Progress Notes (Signed)
Diabetes Self-Management Education  Visit Type: First/Initial  Appt. Start Time: 1000 Appt. End Time: 1115  12/06/2018  Mr. Xavier Kennedy, identified by name and date of birth, is a 63 y.o. male with a diagnosis of Diabetes: Type 2.   ASSESSMENT  There were no vitals taken for this visit. There is no height or weight on file to calculate BMI.   PMH: includes 2 hip replacements, 3 back surgeries, rotator cuff and hand surgeries. Pt states he has been using a cane since 1997.  Social Support: Pt states he has a large family base which is very supportive and several members are in the medical field. Pt states his sister calls him daily wants to know what he is eating and what his BG readings are. Pt states his vision is better than when he was admitted to hospital but still impaired and is not driving. Pt states family members help to provide transportation.  Medication: Pt states he has T2DM since 2013 but did not feel bad and was taking metformin "whenever I feel like it" but after his hospital admissionon and temporary loss of vision, he started taking all his medications as prescribed. Pt was not aware that metformin dose has increased to 2000 mg and was still taking just 1000 mg, but will take as directed. Pt states Invokana was prescribed in the hospital without refill and his doctor did not refill this prescription. Patient's current BG readings appear he may not need it especially if he cuts back the juice intake and increases to the prescribed amount of metformin. Pt stated insulin technique is correct.   Stress/Sleep: Pt states back pain affects sleep, wakes up at 3 am sits in chair watches TV. Nods off watching TV. Patient states he is in constant pain but makes an effort to keep up a good attitude.  Diet: Pt cut out soda. Pt states he loves juice and has been drinking it daily and didn't know that could be a problem. Pt states he really enjoys fish.   Smoking: Pt states he may  consider quitting in the future but right now he feels he needs to keep something that he enjoys. Pt has a routine of drinking coffee and smoking in the morning.  Diabetes Self-Management Education - 12/06/18 1011      Visit Information   Visit Type  First/Initial      Initial Visit   Diabetes Type  Type 2    Are you currently following a meal plan?  No    Are you taking your medications as prescribed?  Yes   metformin, invokana, levimer 15 u   Date Diagnosed  2013      Health Coping   How would you rate your overall health?  Fair      Psychosocial Assessment   Patient Belief/Attitude about Diabetes  Afraid    What is the last grade level you completed in school?  11      Complications   Last HgB A1C per patient/outside source  15.5 %   per hospital lab   How often do you check your blood sugar?  3-4 times/day    Fasting Blood glucose range (mg/dL)  35-465;681-275;170-017   92-200   Postprandial Blood glucose range (mg/dL)  >494   only checked 2x after pizza and spaghetti   Number of hypoglycemic episodes per month  0    Number of hyperglycemic episodes per week  2    Can you tell when your blood sugar  is high?  Yes    What do you do if your blood sugar is high?  drank water, relax due to funny feeling in head    Have you had a dilated eye exam in the past 12 months?  No   appt next month   Have you had a dental exam in the past 12 months?  No    Are you checking your feet?  Yes    How many days per week are you checking your feet?  7      Dietary Intake   Breakfast  instant grits, egg, sausage, 16-20 oz OJ    Snack (morning)  none OR 2 cream filled cookies    Lunch  2 hotdogs, apple juice, apple sauce    Snack (afternoon)  none    Dinner  pork chop, salad, 1 piece of bread    Snack (evening)  sugar free apple pie    Beverage(s)  water, juice, coffee 3 cigarettes      Exercise   Exercise Type  ADL's    How many days per week to you exercise?  0    How many minutes  per day do you exercise?  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    Medications  Reviewed patients medication for diabetes, action, purpose, timing of dose and side effects.    Monitoring  Purpose and frequency of SMBG.    Acute complications  Taught treatment of hypoglycemia - the 15 rule.    Chronic complications  Assessed and discussed foot care and prevention of foot problems    Psychosocial adjustment  Role of stress on diabetes;Other (comment)   chronic pain   Personal strategies to promote health  Review risk of smoking and offered smoking cessation      Individualized Goals (developed by patient)   Nutrition  General guidelines for healthy choices and portions discussed    Medications  take my medication as prescribed    Monitoring   test my blood glucose as discussed      Outcomes   Expected Outcomes  Demonstrated interest in learning. Expect positive outcomes    Future DMSE  2 months    Program Status  Completed       Individualized Plan for Diabetes Self-Management Training:   Learning Objective:  Patient will have a greater understanding of diabetes self-management. Patient education plan is to attend individual and/or group sessions per assessed needs and concerns.   Patient Instructions  Consider eating balanced meals and snacks You can check your blood sugar about 2 hours after a meal if you want to know if that meal worked to keep your blood sugar in range. Consider  taking your medications as directed. Check your metformin, it appears your prescription calls for 2 pills am and 2 pills pm. When eating fruit be sure to include protein. American Heart Association recommendation to eat fish 2 or 3  times per week. Continue with your plan keep up with other medical care appointments.   Expected Outcomes:  Demonstrated interest in learning. Expect positive outcomes  Education material provided: ADA - How to Thrive: A Guide for Your Journey with Diabetes, A1C conversion sheet and Snack sheet  If problems or questions, patient to contact team via:  Phone  Future DSME appointment: 2 months

## 2019-01-31 ENCOUNTER — Ambulatory Visit: Payer: Medicaid Other | Admitting: Registered"

## 2019-05-03 IMAGING — CT CT HEAD W/O CM
3 series · 15 of 47 positions shown, 18 images · non-contrast
Comparison: None.

CLINICAL DATA: Severe headache, lost vision 1 week ago, cough,
nausea, vomiting, history diabetes mellitus, hypertension

EXAM:
CT HEAD WITHOUT CONTRAST
TECHNIQUE: Contiguous axial images were obtained from the base of the skull
through the vertex without intravenous contrast. Sagittal and
coronal MPR images reconstructed from axial data set.

[Series 2: head wo · axial · 0.43mm/px · z∈[-383,-248]mm · 9 of 33 slices shown, 12 images]
[im 3/33  brain]
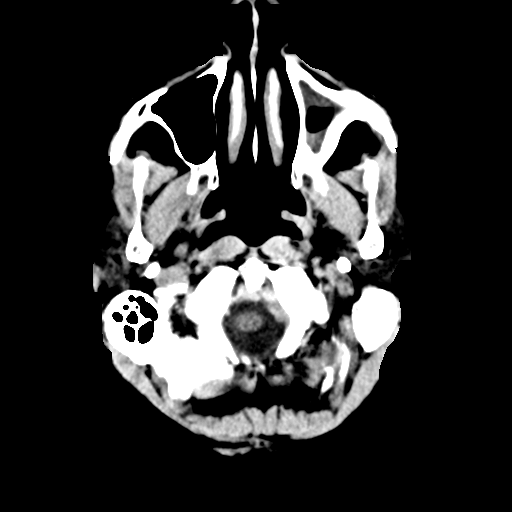
[im 3/33  bone]
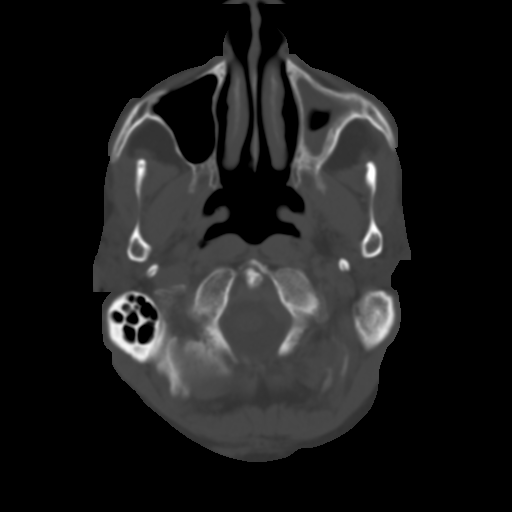
[im 6/33  brain]
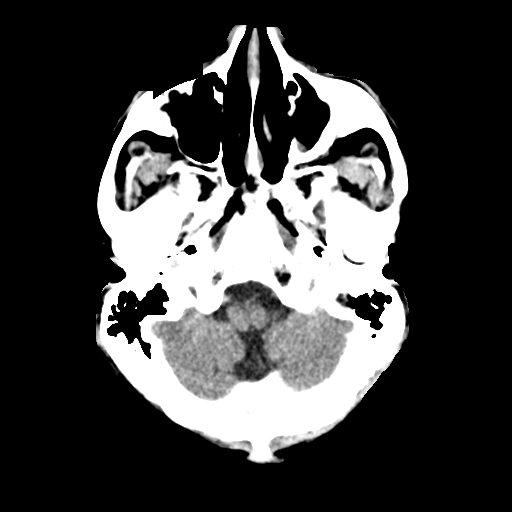
[im 9/33  brain]
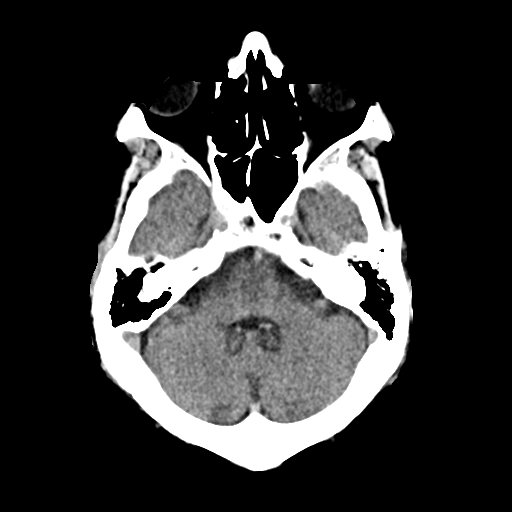
[im 13/33  brain]
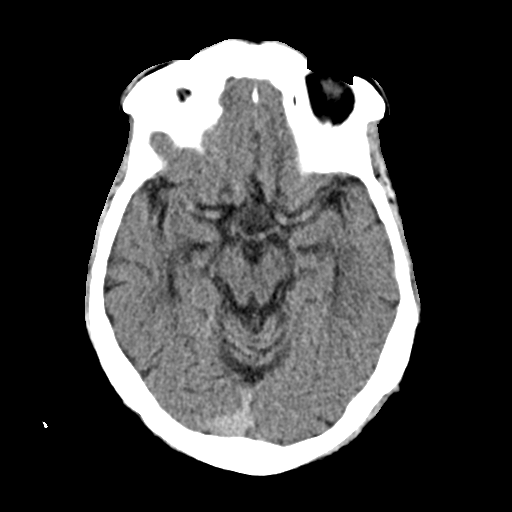
[im 17/33  brain]
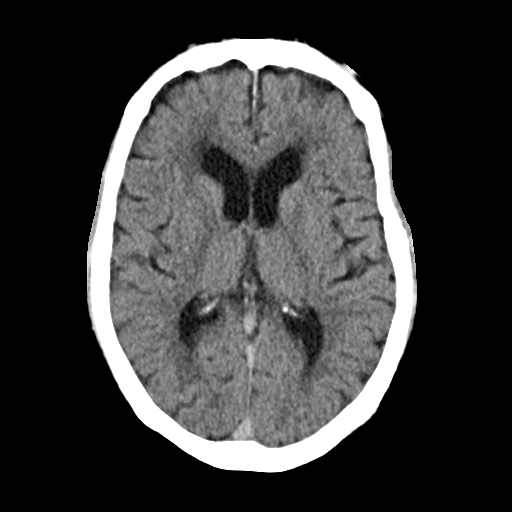
[im 17/33  bone]
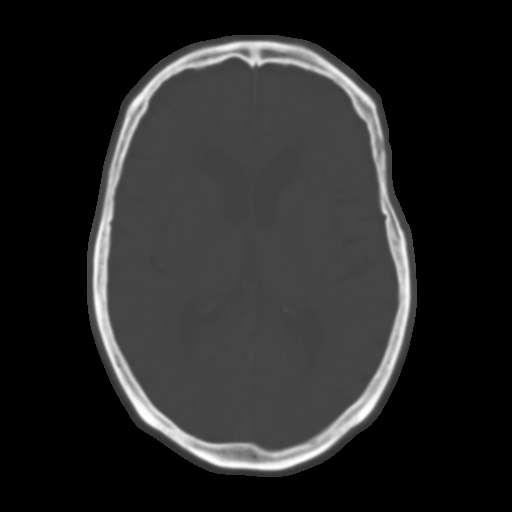
[im 20/33  brain]
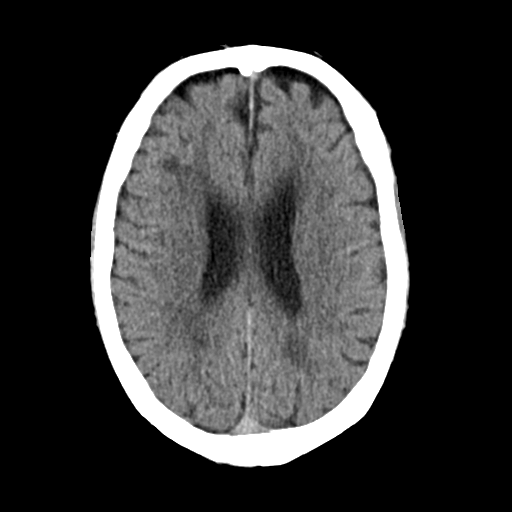
[im 24/33  brain]
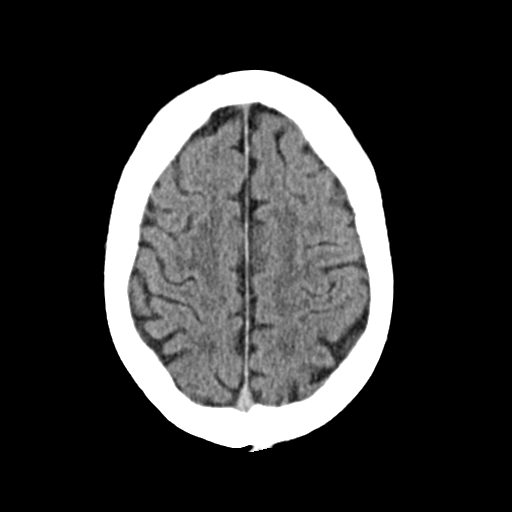
[im 27/33  brain]
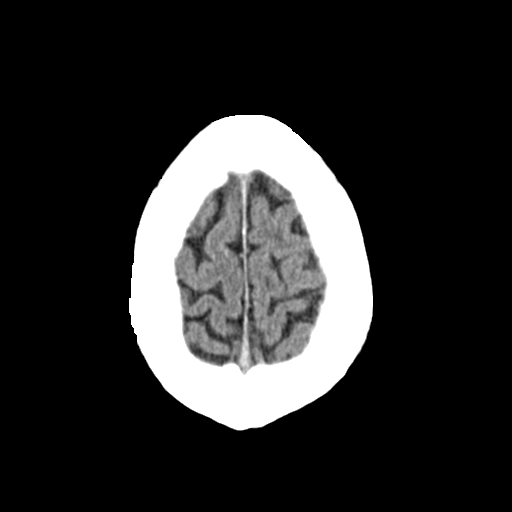
[im 30/33  brain]
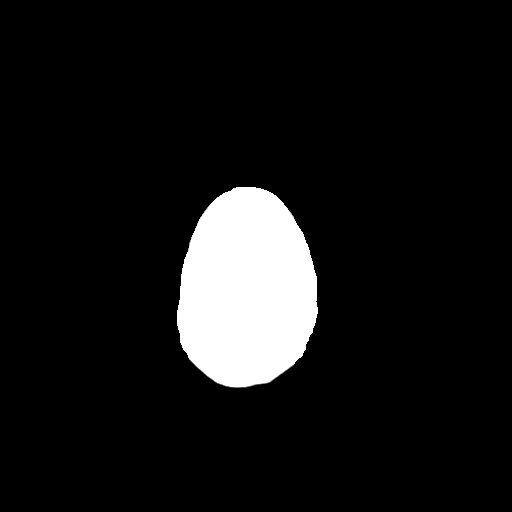
[im 30/33  bone]
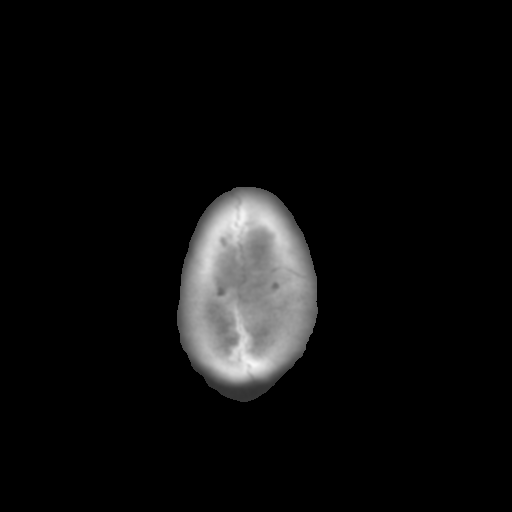

[Series 4: coronal soft · coronal · 0.32mm/px · 3 of 67 slices shown]
[im 23/67  brain]
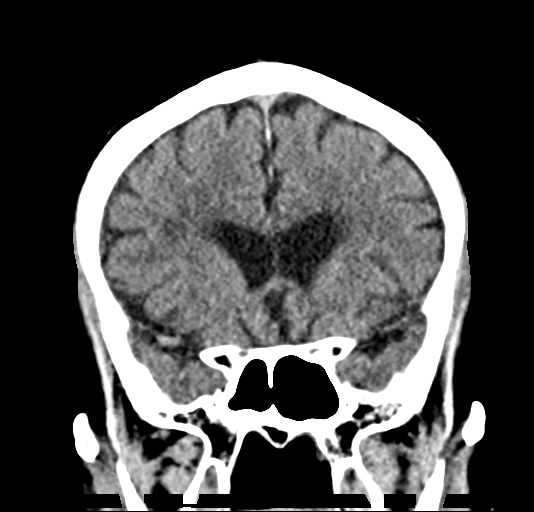
[im 30/67  brain]
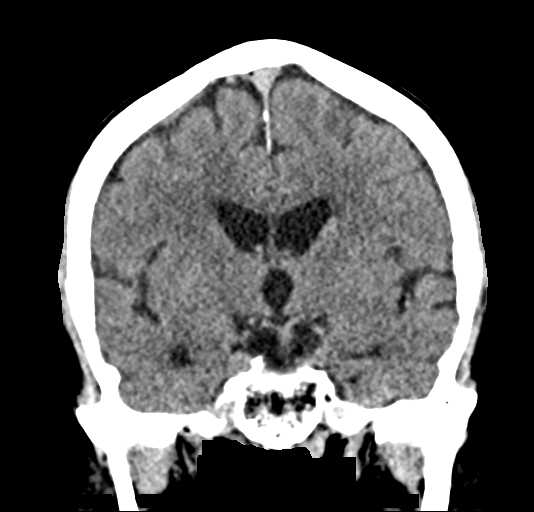
[im 37/67  brain]
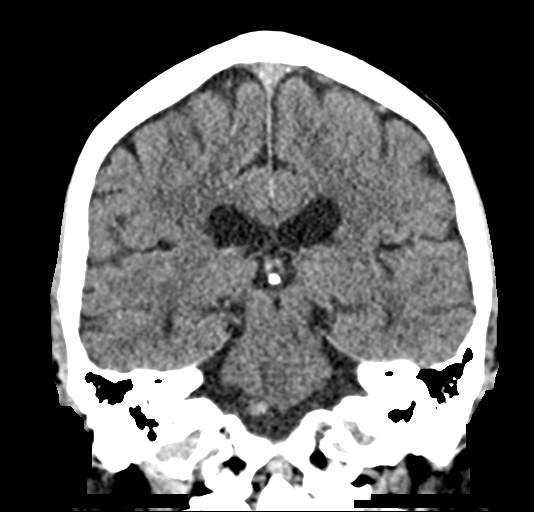

[Series 5: sag soft · sagittal · 0.32mm/px · 3 of 58 slices shown]
[im 20/58  brain]
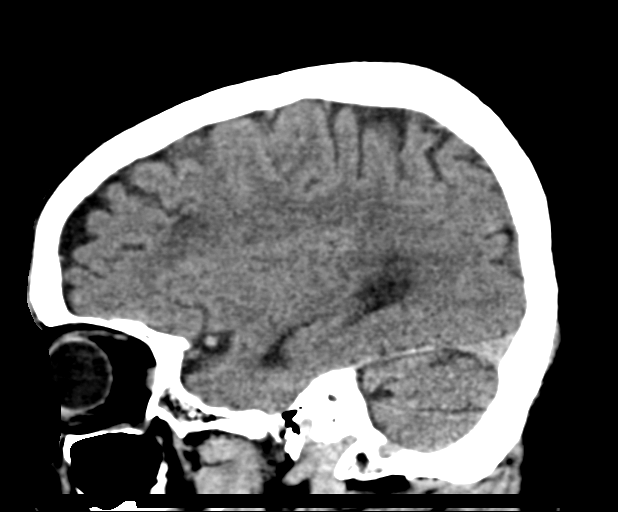
[im 29/58  brain]
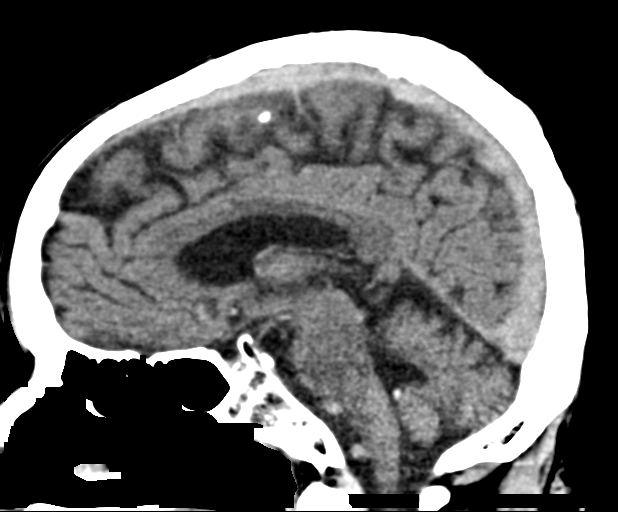
[im 39/58  brain]
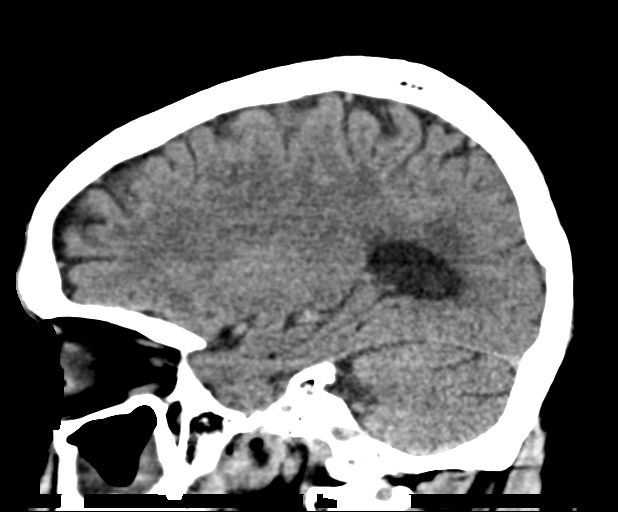

[15 of 47 positions shown; findings below may reference images not displayed]

FINDINGS: Brain: Generalized atrophy. Normal ventricular morphology. No
midline shift or mass effect. Small vessel chronic ischemic changes
of deep cerebral white matter. Question subcortical white matter
infarct and RIGHT frontal lobe versus pronounced white matter
disease. No intracranial hemorrhage, mass lesion or additional
evidence of infarction. No extra-axial fluid collections.

Vascular: Atherosclerotic calcifications of internal carotid
arteries bilaterally at skull base

Skull: Calvaria intact. Incomplete posterior arch C1, developmental
variant.

Sinuses/Orbits: Mucosal thickening LEFT maxillary sinus.

Other: N/A
IMPRESSION: Atrophy with small vessel chronic ischemic changes of deep cerebral
white matter.

Probable subcortical white matter infarct RIGHT frontal lobe.

No additional intracranial abnormalities.

## 2020-11-30 IMAGING — US US ABDOMEN COMPLETE
1 series · 13 of 25 positions shown · non-contrast
Comparison: 01/18/2015 ultrasound

CLINICAL DATA: 62-year-old male with elevated LFTs. Patient with
hepatitis-C and diabetes.

EXAM:
ABDOMEN ULTRASOUND COMPLETE

[Series 1: us abdomen complete · 0.23mm/px · 13 of 89 slices shown]
[im 1/89]
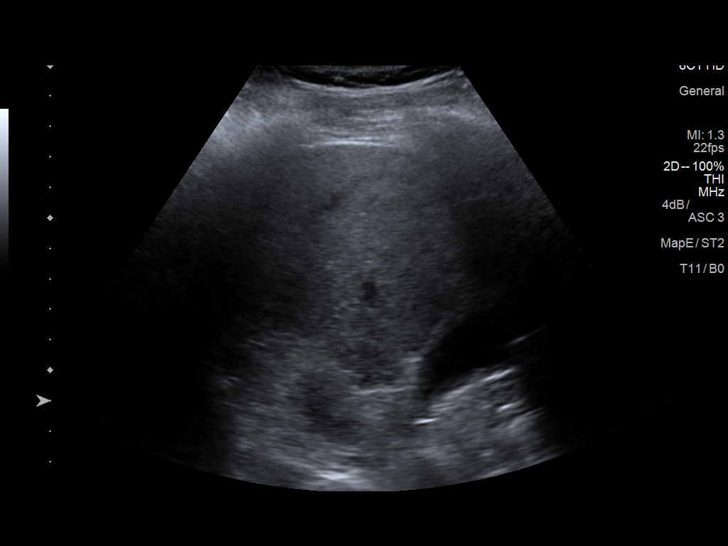
[im 8/89]
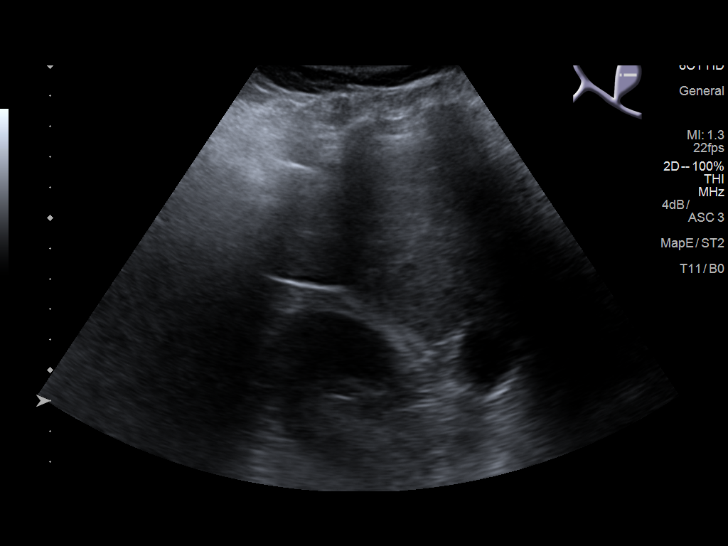
[im 15/89]
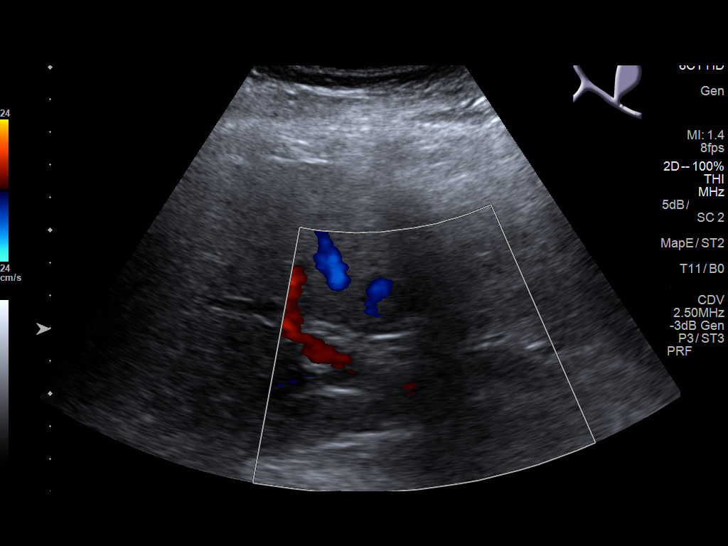
[im 23/89]
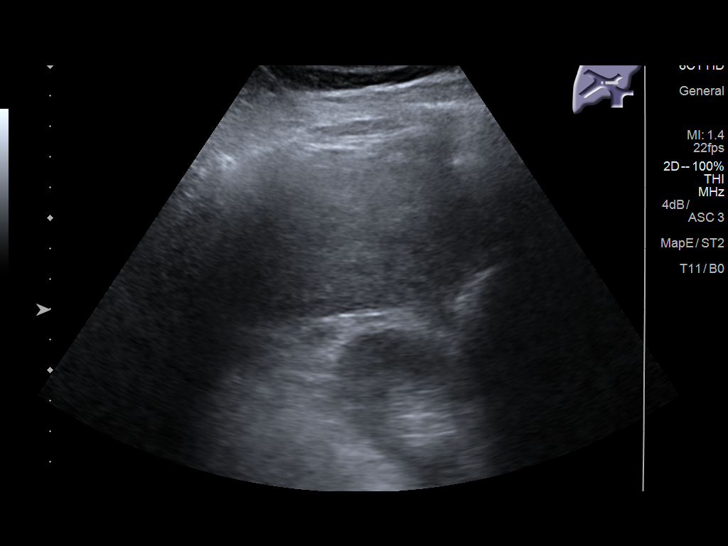
[im 30/89]
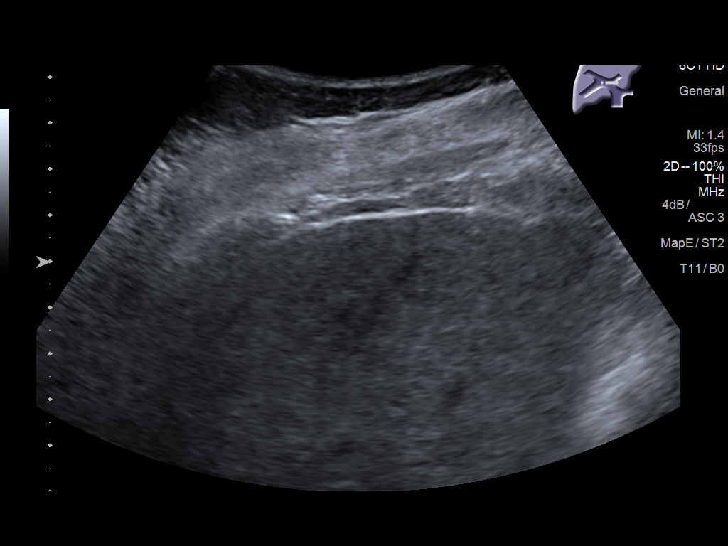
[im 37/89]
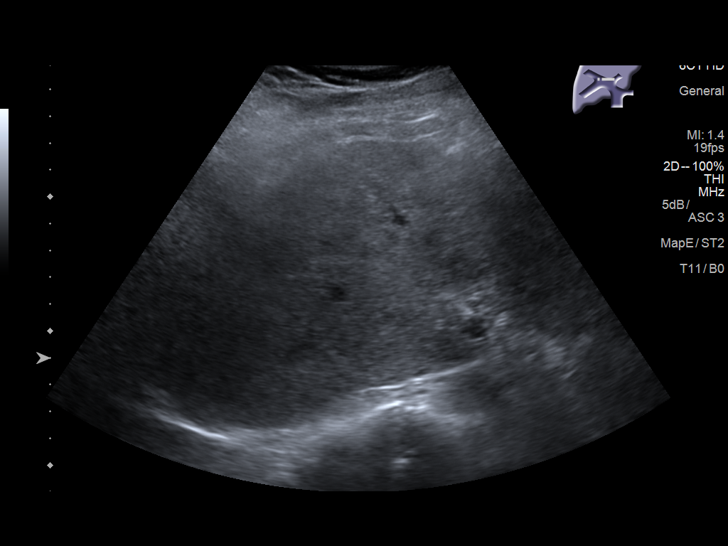
[im 45/89]
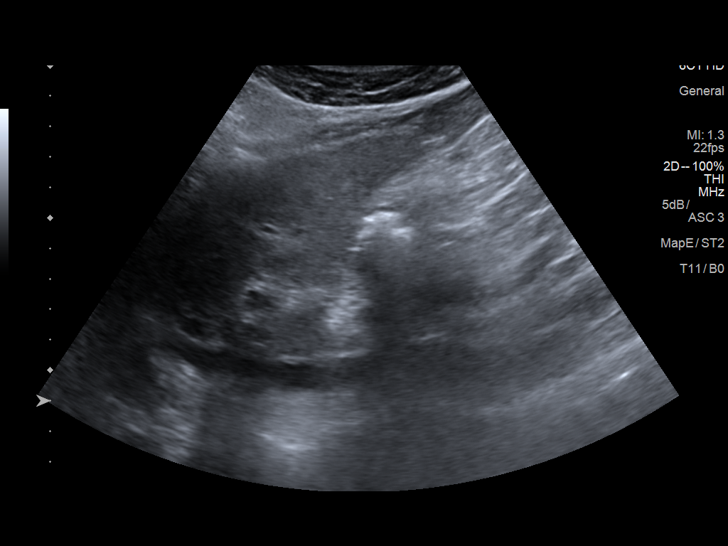
[im 52/89]
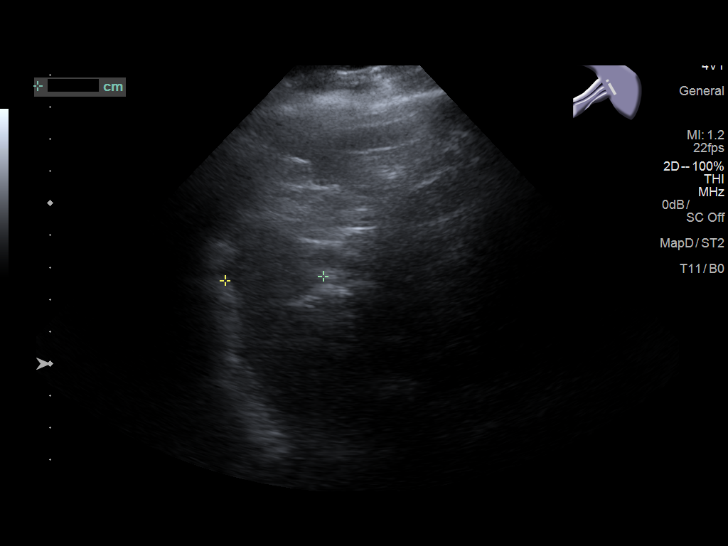
[im 59/89]
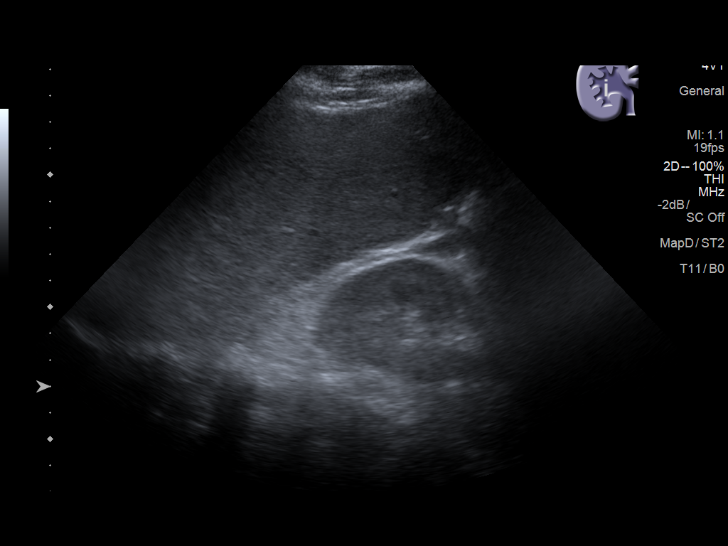
[im 67/89]
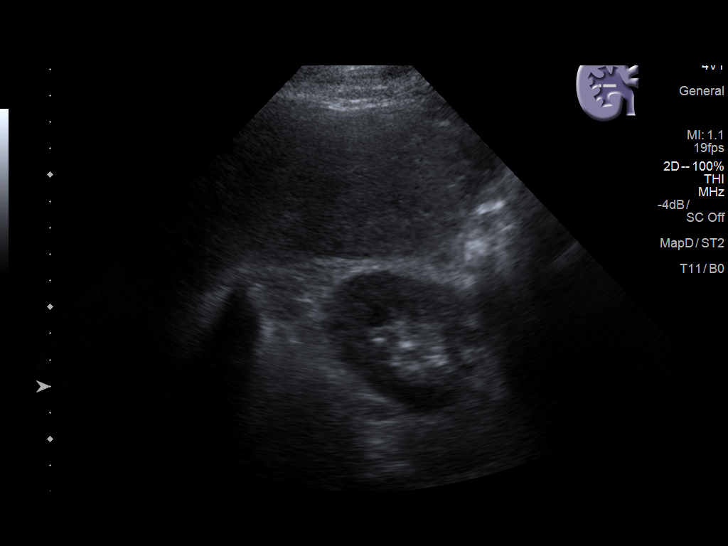
[im 74/89]
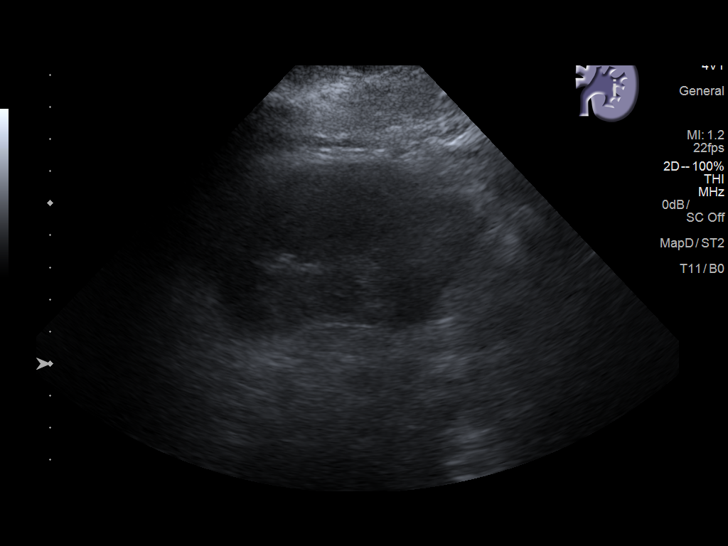
[im 81/89]
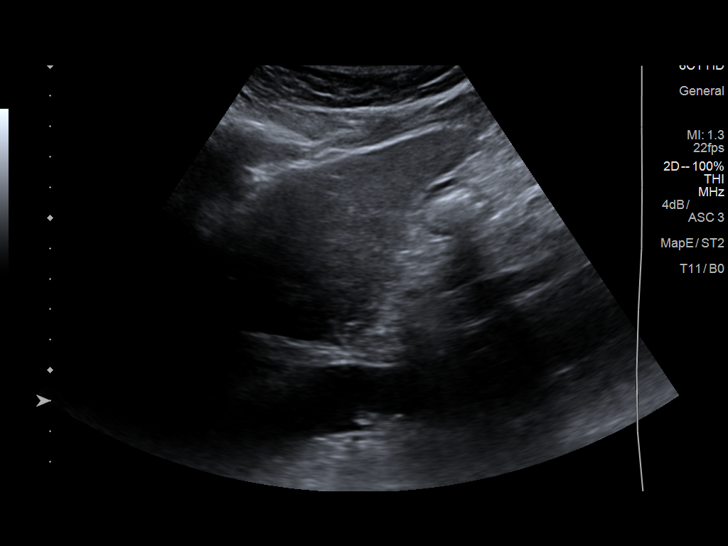
[im 89/89]
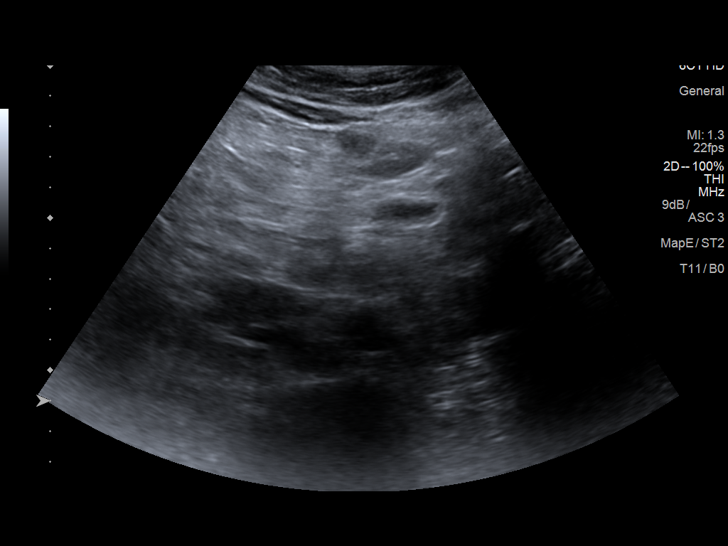

[13 of 25 positions shown; findings below may reference images not displayed]

FINDINGS: Gallbladder: Mild gallbladder wall thickening is unchanged. There is
no evidence of cholelithiasis, sonographic Murphy sign or
pericholecystic fluid.

Common bile duct: Diameter: 3 mm. No intrahepatic or extrahepatic
biliary dilatation.

Liver: Slightly heterogeneous and increased echogenicity of the
hepatic echotexture noted. No focal hepatic abnormalities are
identified. Portal vein is patent on color Doppler imaging with
normal direction of blood flow towards the liver.

IVC: No abnormality visualized.

Pancreas: Visualized portion unremarkable.

Spleen: Size and appearance within normal limits.

Right Kidney: Length: 10.7 cm. Echogenicity within normal limits. No
mass or hydronephrosis visualized.

Left Kidney: Length: 10.1 cm. Echogenicity within normal limits. No
mass or hydronephrosis visualized.

Abdominal aorta: No aneurysm visualized.

Other findings: None.
IMPRESSION: 1. Slightly heterogeneous and echogenic hepatic echotexture which
may represent hepatic steatosis.
2. Mild gallbladder wall thickening again noted without
cholelithiasis or other signs of acute cholecystitis, probably
related to hepatic dysfunction.
3. No evidence of biliary dilatation.
